# Patient Record
Sex: Male | Born: 1940 | Race: White | Hispanic: No | Marital: Single | Smoking: Former smoker
Health system: Southern US, Community
[De-identification: ages and names within clinical notes are randomized; demographics above are authoritative.]

## PROBLEM LIST (undated history)

## (undated) DIAGNOSIS — E559 Vitamin D deficiency, unspecified: Secondary | ICD-10-CM

## (undated) DIAGNOSIS — I700 Atherosclerosis of aorta: Secondary | ICD-10-CM

## (undated) DIAGNOSIS — I10 Essential (primary) hypertension: Secondary | ICD-10-CM

## (undated) DIAGNOSIS — K22719 Barrett's esophagus with dysplasia, unspecified: Secondary | ICD-10-CM

## (undated) DIAGNOSIS — K219 Gastro-esophageal reflux disease without esophagitis: Secondary | ICD-10-CM

## (undated) HISTORY — PX: TONSILLECTOMY: SUR1361

## (undated) HISTORY — DX: Vitamin D deficiency, unspecified: E55.9

## (undated) HISTORY — PX: KNEE SURGERY: SHX244

## (undated) HISTORY — PX: SHOULDER SURGERY: SHX246

## (undated) HISTORY — PX: CARPAL TUNNEL RELEASE: SHX101

## (undated) HISTORY — DX: Atherosclerosis of aorta: I70.0

## (undated) HISTORY — DX: Barrett's esophagus with dysplasia, unspecified: K22.719

---

## 2005-02-25 ENCOUNTER — Ambulatory Visit (HOSPITAL_COMMUNITY): Admission: RE | Admit: 2005-02-25 | Discharge: 2005-02-25 | Payer: Self-pay | Admitting: Gastroenterology

## 2008-05-03 ENCOUNTER — Inpatient Hospital Stay (HOSPITAL_COMMUNITY): Admission: EM | Admit: 2008-05-03 | Discharge: 2008-05-06 | Payer: Self-pay | Admitting: Emergency Medicine

## 2010-07-15 LAB — CBC
HCT: 41.1 % (ref 39.0–52.0)
HCT: 45.2 % (ref 39.0–52.0)
HCT: 46.4 % (ref 39.0–52.0)
Hemoglobin: 13.9 g/dL (ref 13.0–17.0)
Hemoglobin: 15.6 g/dL (ref 13.0–17.0)
MCHC: 33.9 g/dL (ref 30.0–36.0)
MCV: 94.2 fL (ref 78.0–100.0)
MCV: 94.4 fL (ref 78.0–100.0)
MCV: 94.8 fL (ref 78.0–100.0)
Platelets: 136 10*3/uL — ABNORMAL LOW (ref 150–400)
Platelets: 187 10*3/uL (ref 150–400)
RBC: 4.36 MIL/uL (ref 4.22–5.81)
RBC: 4.36 MIL/uL (ref 4.22–5.81)
RBC: 4.87 MIL/uL (ref 4.22–5.81)
RDW: 13.8 % (ref 11.5–15.5)
WBC: 12.1 10*3/uL — ABNORMAL HIGH (ref 4.0–10.5)
WBC: 13.5 10*3/uL — ABNORMAL HIGH (ref 4.0–10.5)
WBC: 16.8 10*3/uL — ABNORMAL HIGH (ref 4.0–10.5)
WBC: 19.8 10*3/uL — ABNORMAL HIGH (ref 4.0–10.5)

## 2010-07-15 LAB — CARDIAC PANEL(CRET KIN+CKTOT+MB+TROPI)
CK, MB: 1.3 ng/mL (ref 0.3–4.0)
CK, MB: 1.4 ng/mL (ref 0.3–4.0)
Relative Index: 1.3 (ref 0.0–2.5)
Total CK: 136 U/L (ref 7–232)
Troponin I: <0.01 ng/mL (ref 0.00–0.06)
Troponin I: <0.01 ng/mL (ref 0.00–0.06)

## 2010-07-15 LAB — COMPREHENSIVE METABOLIC PANEL
ALT: 18 U/L (ref 0–53)
ALT: 21 U/L (ref 0–53)
AST: 21 U/L (ref 0–37)
Alkaline Phosphatase: 31 U/L — ABNORMAL LOW (ref 39–117)
Alkaline Phosphatase: 38 U/L — ABNORMAL LOW (ref 39–117)
BUN: 14 mg/dL (ref 6–23)
CO2: 21 mEq/L (ref 19–32)
CO2: 27 mEq/L (ref 19–32)
CO2: 28 mEq/L (ref 19–32)
Calcium: 8.1 mg/dL — ABNORMAL LOW (ref 8.4–10.5)
Chloride: 101 mEq/L (ref 96–112)
Chloride: 106 mEq/L (ref 96–112)
Chloride: 97 mEq/L (ref 96–112)
Creatinine, Ser: 0.94 mg/dL (ref 0.4–1.5)
Creatinine, Ser: 1.05 mg/dL (ref 0.4–1.5)
GFR calc Af Amer: >60 mL/min (ref >60)
GFR calc non Af Amer: >60 mL/min (ref >60)
GFR calc non Af Amer: >60 mL/min (ref >60)
Glucose, Bld: 108 mg/dL — ABNORMAL HIGH (ref 70–99)
Glucose, Bld: 137 mg/dL — ABNORMAL HIGH (ref 70–99)
Potassium: 3.2 mEq/L — ABNORMAL LOW (ref 3.5–5.1)
Potassium: 3.5 mEq/L (ref 3.5–5.1)
Sodium: 136 mEq/L (ref 135–145)
Total Bilirubin: 0.9 mg/dL (ref 0.3–1.2)
Total Bilirubin: 1.2 mg/dL (ref 0.3–1.2)
Total Bilirubin: 1.5 mg/dL — ABNORMAL HIGH (ref 0.3–1.2)
Total Protein: 6.4 g/dL (ref 6.0–8.3)

## 2010-07-15 LAB — URINALYSIS, ROUTINE W REFLEX MICROSCOPIC
Bilirubin Urine: NEGATIVE
Hgb urine dipstick: NEGATIVE
Protein, ur: NEGATIVE mg/dL
Specific Gravity, Urine: 1.019 (ref 1.005–1.030)
Urobilinogen, UA: 1 mg/dL (ref 0.0–1.0)

## 2010-07-15 LAB — BASIC METABOLIC PANEL
BUN: 7 mg/dL (ref 6–23)
Chloride: 103 mEq/L (ref 96–112)
Creatinine, Ser: 1.21 mg/dL (ref 0.4–1.5)
GFR calc Af Amer: >60 mL/min (ref >60)
GFR calc non Af Amer: 60 mL/min — ABNORMAL LOW (ref >60)

## 2010-07-15 LAB — LIPASE, BLOOD
Lipase: 21 U/L (ref 11–59)
Lipase: 26 U/L (ref 11–59)
Lipase: 34 U/L (ref 11–59)

## 2010-07-15 LAB — CULTURE, BLOOD (ROUTINE X 2)

## 2010-07-15 LAB — DIFFERENTIAL
Basophils Absolute: 0 10*3/uL (ref 0.0–0.1)
Basophils Absolute: 0 10*3/uL (ref 0.0–0.1)
Basophils Relative: 0 % (ref 0–1)
Basophils Relative: 0 % (ref 0–1)
Eosinophils Absolute: 0 10*3/uL (ref 0.0–0.7)
Eosinophils Absolute: 0.1 10*3/uL (ref 0.0–0.7)
Eosinophils Relative: 0 % (ref 0–5)
Lymphocytes Relative: 3 % — ABNORMAL LOW (ref 12–46)
Lymphocytes Relative: 7 % — ABNORMAL LOW (ref 12–46)
Monocytes Absolute: 1 10*3/uL (ref 0.1–1.0)
Neutrophils Relative %: 92 % — ABNORMAL HIGH (ref 43–77)

## 2010-07-15 LAB — LIPID PANEL
Cholesterol: 132 mg/dL (ref 0–200)
Total CHOL/HDL Ratio: 2.9 RATIO

## 2010-07-15 LAB — TSH: TSH: 0.447 u[IU]/mL (ref 0.350–4.500)

## 2010-07-15 LAB — PROTIME-INR: Prothrombin Time: 15.3 seconds — ABNORMAL HIGH (ref 11.6–15.2)

## 2010-07-15 LAB — BRAIN NATRIURETIC PEPTIDE: Pro B Natriuretic peptide (BNP): 149 pg/mL — ABNORMAL HIGH (ref 0.0–100.0)

## 2010-07-15 LAB — AMYLASE: Amylase: 30 U/L (ref 27–131)

## 2010-07-15 LAB — HEMOGLOBIN A1C: Hgb A1c MFr Bld: 5.5 % (ref 4.6–6.1)

## 2010-08-12 NOTE — Discharge Summary (Signed)
NAME:  Jerome Pham, Jerome Pham              ACCOUNT NO.:  [id number]   MEDICAL RECORD NO.:  [id number]          PATIENT TYPE:  INP   LOCATION:  1426                         FACILITY:  [facility]   PHYSICIAN:  Corinna L. Lendell Caprice, MDDATE OF BIRTH:  1940/04/16   DATE OF ADMISSION:  05/03/2008  DATE OF DISCHARGE:  05/06/2008                               DISCHARGE SUMMARY   DISCHARGE DIAGNOSES:  1. Vomiting and abdominal pain possibly gastroenteritis.  2. Hypokalemia, repleted.  3. Gastroesophageal reflux disease.  4. History of hypertension.   DISCHARGE MEDICATIONS:  1. Flagyl 500 mg p.o. t.i.d. until gone.  2. Cipro 500 mg p.o. b.i.d. until gone.  3. Phenergan 25 mg p.o. q.6 hours p.r.n. nausea.  4. Tylenol 650 mg every 4 hours as needed for fever or pain.  5. Imodium 2 mg as needed for diarrhea.  6. Hold Diovan/hydrochlorothiazide for a week.  7. Continue Protonix 40 mg daily.   FOLLOW UP:  With Dr. Theresia Lo next week.  Follow up with Dr. Colin Benton if  symptoms worsen, particularly pain.   CONDITION:  Improved.   ACTIVITY:  No driving while on nausea medications.   DIET:  As tolerated.   CONSULTATIONS:  Dr. Alfonse Ras.   PROCEDURES:  None.   LABS ON ADMISSION:  Shows white count was 12,000, 88% neutrophils, 7%  lymphocytes.  PT/PTT normal.  Complete metabolic panel on admission  significant for a potassium of 3.2, at discharge is 3.5.  Liver function  tests unremarkable.  Lipase negative.  Hemoglobin A1c 5.5.  Amylase  normal.  BNP 149.  Serial cardiac enzymes negative.  LDL 78, HDL 46,  cholesterol 132, TSH 0.447.  Urinalysis showed 15 ketones, otherwise  negative.  Blood cultures negative.   SPECIAL STUDIES/RADIOLOGY:  EKG shows normal sinus bradycardia with a  rate of 54, left assays axis deviation, and nonspecific changes.  Chest  x-ray, two views showed nothing acute.  Ultrasound of the abdomen showed  nothing acute.  Abdominal aorta and pancreas obscured by bowel gas.  Liver, gallbladder, common duct, intrahepatic IVC, spleen and kidneys  unremarkable.  No ascites.  CT of the abdomen and pelvis showed  gallbladder distended with mild edema in its wall and pericholecystic  edema/inflammation, enhancement of the wall the common bile duct without  evidence of bile duct dilatation, no gallstones.  Edema and inflammation  also surrounding the descending portion of the duodenum, 13 mm nodule in  the left adrenal gland, left kidney cyst, mild distal abdominal aortic  atherosclerosis, small hiatal hernia.  Apparent thickening of the wall  of the gastric fundus felt to be related to under distention.  Lung bases demonstrate linear atelectasis findings consistent with  acalculous cholecystitis versus peptic ulcer disease in the descending  duodenum.  Favor acalculous cholecystitis.  Enhancement of the wall of  the nondistended common duct, query cholangitis.  HIDA scan showed  initial non-filling of the gallbladder over 60 minutes with prompt  filling after administration of morphine, suggestive of more likely  chronic pattern of cholecystitis.  No evidence of biliary ductal  dilatation.   HISTORY AND HOSPITAL  COURSE:  Mr. Jerome Pham is a 70 year old white male  patient of Dr. Theresia Lo who presented with sudden onset epigastric pain,  burning, diaphoresis, nausea.  He did have dry heaves.  Please see H and  P for details.  He was afebrile, had a heart rate of 54, respiratory  rate of 20, otherwise normal vital signs.  He had slight epigastric  tenderness.  White blood cell count was 12,000.  The patient was  admitted, ruled out for MI.  There was concern that this was related to  gastroesophageal reflux disease.  When I saw the patient on the fifth he  was complaining of right upper quadrant pain after eating.  He had  vomited and spiked a fever.  I have put him empirically on ciprofloxacin  and ordered right upper quadrant ultrasound which was unremarkable.   Within the differential was gastroenteritis as well.  The patient had a  subsequent CAT scan which showed possible cholecystitis and the Flagyl  was added.  Dr. Colin Benton was consulted and recommended HIDA scan.  She  evaluated the patient on the 7th and felt that he had possibly chronic  cholecystitis, probable viral gastroenteritis and did not recommend  cholecystectomy at this time.  The patient also developed diarrhea  during the hospitalization.  At the time of discharge he has no right  upper quadrant tenderness.  His appetite is not back to his baseline but  he complains of no nausea currently.  His abdomen is soft and nontender  today.  The patient was adamant about leaving to go to a [employer].  I have voiced concern about his leukocytosis, fever,  and he voices understanding.  I have given him a prescription for  ciprofloxacin, Flagyl, Phenergan rather than have him sign out against  medical advice.  I did strongly advise that he come back immediately for  worsening of his symptoms.  I also recommended close followup with Dr.  Theresia Lo, and/or Dr. Colin Benton should his symptoms worsen.  The patient's  blood pressure was normal off his blood pressure medications and will  need to be held for at least a week or until his blood pressure becomes  elevated.  Total time on the day of discharge is 45 minutes.  I have  read Dr. Tami Lin note and spoken with her yesterday and today.      Corinna L. Lendell Caprice, MD  Electronically Signed     CLS/MEDQ  D:  05/06/2008  T:  05/06/2008  Job:  [id number]   cc:   Vikki Ports, M.D.  Fax: [phone]   Alfonse Ras, MD  [address], [address]  [place]  [place] [address]

## 2010-08-12 NOTE — H&P (Signed)
NAME:  Jerome Pham, Jerome Pham NO.:  [id number]   MEDICAL RECORD NO.:  [id number]          PATIENT TYPE:  INP   LOCATION:  0104                         FACILITY:  [facility]   PHYSICIAN:  Michiel Cowboy, MDDATE OF BIRTH:  1940/11/26   DATE OF ADMISSION:  05/03/2008  DATE OF DISCHARGE:                              HISTORY & PHYSICAL   PRIMARY CARE PHYSICIAN:  Dr. Theresia Lo.   CHIEF COMPLAINT:  Epigastric pain, dry heaves and diaphoresis.   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  The pain was not radiating neither to the neck or his arm.  He  did have any shortness of breath associated with that but he went to his  primary care Shakhia Gramajo who obtained an EKG which was unchanged from prior  but still felt that given his age and risk factors probably would  benefit from having further evaluation in the emergency department where  he presented.  Here his first set of markers are unremarkable.  EKG  appears to be nonischemic with a normal chest x-ray.  At which point  [facility] hospitalist was called for further evaluation.  The patient  otherwise denies any fevers,  no chills and no history of exertional  chest pain or shortness of breath.  He does play golf and stays fairly  active.  He himself does not have any history of coronary artery  disease.   PAST MEDICAL HISTORY:  Significant for GERD and hypertension.   SOCIAL HISTORY:  The patient used to smoke but quit in 1969.  He drinks  about three beers per day.  Never had history of withdrawal. When he  occasionally does stop. He has no symptoms consistent with withdrawal.   FAMILY HISTORY:  Consistent with a  father who has had heart disease at  an early age and had four brothers who died of  heart disease.  The  patient himself is an only child.   ALLERGIES:  NO KNOWN DRUG ALLERGIES.   MEDICATIONS:  1. Recently was switched from Nexium to Prilosec 20 mg daily.  2. Diovan/ hydrochlorothiazide 160/12.5 daily.   REVIEW OF SYSTEMS:  Negative except for his  HPI.   VITAL SIGNS:  Temperature 97.1, blood pressure 129/75, pulse 54,  respirations 20, saturating 100% on room air.   PHYSICAL EXAMINATION:  The patient appears to be in no acute distress.  HEAD:  Nontraumatic.  Moist mucous membranes but slightly decreased skin  turgor.  LUNGS:  Clear to auscultation bilaterally.  HEART:  Regular rate and rhythm, although slightly distant but no  murmurs appreciated.  ABDOMEN:  There is slight epigastric tenderness but otherwise  unremarkable.  LOWER EXTREMITIES:  Without clubbing, cyanosis or edema.  NEUROLOGIC:  Intact.   LABS:  White blood cell count 12.1, hemoglobin  15.2, sodium 134,  potassium 3.2, creatinine 1.05, lipase 26.  Cardiac markers are  unremarkable.  UA within normal limits.  Chest x-ray within normal  limits.  EKG showing heart rate of 54, sinus bradycardia with sinus  arrhythmia.  There is a slight flattening of T wave in lead III and aVF;  overall there is also a slightly diminished QRS.  But EKG  appears to be overall nonischemic.  Per comparison to EKG obtained at  the office no change and there is also no significant  change in  comparison to EKG obtained from November 2006.   Of note, while in the emergency depart the patient received a dose of  nitroglycerin which  dropped his blood pressure.  The patient was  chest  pain free by the time he got to the emergency department and currently  still  is chest pain free.   ASSESSMENT/PLAN:  This is a 70 year old gentleman with history of  gastroesophageal reflux disease and hypertension defect over the past  medical history  who presents with epigastric pain and heartburn but also  some diaphoresis and possibly chest pain.   Chest pain very atypical in nature but given the family history and risk  factors will cycle cardiac enzymes and obtain fasting lipid panel check  hemoglobin A1c.   History of severe gastroesophageal reflux disease.  We will put on  Protonix and Carafate and see if this helps.  Recheck lipase again  given epigastric tenderness.  The patient may need to have GI follow-up  if this persists.   Elevated white blood cell count; I wonder if patient is somewhat  hemoconcentrated.  Will give  fluids and follow.  Currently no evidence  of infection.   Low potassium.  Already replaced.   Hyponatremia, also very mild and probably secondary to mild dehydration.  Will give IV fluids and follow.   Prophylaxis with Lovenox and Protonix.  The patient already received  aspirin in the emergency department and given severe GERD will probably  need to be on Protonix while taking aspirin but as long as he does not  have any contraindications, perhaps being on aspirin would be a good  plan for him for cardiac protection.      Michiel Cowboy, MD  Electronically Signed     AVD/MEDQ  D:  05/03/2008  T:  05/03/2008  Job:  [id number]   cc:   Vikki Ports, M.D.  Fax: [phone]

## 2010-08-12 NOTE — Consult Note (Signed)
NAME:  Jerome Pham, Jerome Pham NO.:  [id number]   MEDICAL RECORD NO.:  [id number]          PATIENT TYPE:  INP   LOCATION:  1426                         FACILITY:  [facility]   PHYSICIAN:  Alfonse Ras, MD   DATE OF BIRTH:  1941-03-06   DATE OF CONSULTATION:  05/06/2008  DATE OF DISCHARGE:                                 CONSULTATION   REASON FOR REFERRAL:  Abdominal pain, rule out cholecystitis.   HISTORY OF PRESENT ILLNESS:  The patient is a very pleasant 70 year old  gentleman who was admitted approximately 3 days ago with epigastric  discomfort and dry heaves.  Over the last 2 days, however, this has  seemed to resolve, although he has started having some diarrhea.  Workup  has included an ultrasound which showed no stones and no inflammation  around the gallbladder.  However CT scan of the abdomen did show some  possible inflammation of the gallbladder wall but again no stones.  HIDA  scan done this morning shows no evidence of cystic duct obstruction,  questionable chronic cholecystitis because of the CT findings.  This  morning on my exam, he says his nausea has resolved.  He did have 2  episodes of diarrhea and denies any abdominal pain.  His white count was  elevated at 16,000 yesterday and is not back for my review this morning.  His liver function tests were normal yesterday.   PAST MEDICAL HISTORY:  Significant for gastroesophageal reflux disease  and hypertension.   SOCIAL HISTORY:  He is a daily user of alcohol about 3 to 6 beers a day.   FAMILY HISTORY:  Noncontributory.   MEDICATIONS:  Include diet and hydrochlorothiazide.   PHYSICAL EXAMINATION:  GENERAL: An age-appropriate white male.  He is in  his bathroom shaving and is in no distress.  VITAL SIGNS: His temperature at present is 98.6, his blood pressure is  124/70, heart rate is 58, and respiratory rate is 16.  HEENT EXAM: Normocephalic and atraumatic.  Pupils are equal, round, and  reactive to  light.  LUNGS: Clear to auscultation and percussion x2.  HEART: Regular rate and rhythm without murmurs, rubs, or gallops.  ABDOMEN: Soft and I do not feel any tenderness in the right upper  quadrant.  EXTREMITY: Exam shows no clubbing, cyanosis, or edema.   IMPRESSION:  Probable viral gastroenteritis, questionable chronic  cholecystitis.  I do not see an indication for cholecystectomy at this  time.  I discussed this with Dr. Crista Curb, and she will manage  the patient with probable antibiotics and I except discharge home.      Alfonse Ras, MD  Electronically Signed     KRE/MEDQ  D:  05/06/2008  T:  05/07/2008  Job:  [id number]

## 2010-08-15 NOTE — Op Note (Signed)
NAME:  Jerome Pham, Jerome Pham NO.:  [id number]   MEDICAL RECORD NO.:  [id number]          PATIENT TYPE:  AMB   LOCATION:  ENDO                         FACILITY:  [facility]   PHYSICIAN:  Petra Kuba, M.D.    DATE OF BIRTH:  10/01/1940   DATE OF PROCEDURE:  02/25/2005  DATE OF DISCHARGE:                                 OPERATIVE REPORT   PROCEDURE:  Colonoscopy.   INDICATIONS FOR PROCEDURE:  Screening.  Consent was signed after risks,  benefits, methods, and options were thoroughly discussed in the office.   MEDICATIONS USED:  Demerol 60, Versed 6.   PROCEDURE:  Rectal inspection was pertinent for small external hemorrhoids.  Digital exam was negative.  The video colonoscope was inserted and easily  advanced around the colon to the cecum.  This did require rolling him on his  back and some abdominal pressure.  Other than an occasional left and right  diverticula, no abnormalities were seen on insertion.  The cecum was  identified by the appendiceal orifice and the ileocecal valve.  The scope  was inserted a short ways in the terminal ileum which was normal.  Photo  documentation was obtained.  The scope was slowly withdrawn.  The prep was  adequate, there was some liquid stool that required washing and suctioning.  On slow withdrawal through the colon, other than the scattered, ascending,  and primarily scattered sigmoid diverticula, no other abnormalities were  seen.  Specifically, no polyps, tumors, or masses.  Once back in the rectum,  anorectal pull through and retroflexion confirmed some small hemorrhoids.  The scope was straightened and readvanced a short ways up the left side of  the colon.  The air was suctioned, the scope was removed.  The patient  tolerated the procedure well.  There was no obvious immediate complications.   ENDOSCOPIC DIAGNOSIS:  1.  Internal and external hemorrhoids.  2.  Scattered sigmoid and ascending diverticula.  3.  Otherwise, within  normal limits to the terminal ileum.   PLAN:  Recheck colon screening in 5-10 years.  Happy to see back p.r.n.  Otherwise, return to the care of Dr. Idell Pickles for the customary health care  screening and maintenance to include yearly rectals and guaiacs.           ______________________________  Petra Kuba, M.D.     MEM/MEDQ  D:  02/25/2005  T:  02/25/2005  Job:  [id number]   cc:   Dellis Anes. Idell Pickles, M.D.  Fax: [phone]

## 2016-07-16 DIAGNOSIS — Z125 Encounter for screening for malignant neoplasm of prostate: Secondary | ICD-10-CM | POA: Diagnosis not present

## 2016-07-16 DIAGNOSIS — Z0000 Encounter for general adult medical examination without abnormal findings: Secondary | ICD-10-CM | POA: Diagnosis not present

## 2016-07-16 DIAGNOSIS — I10 Essential (primary) hypertension: Secondary | ICD-10-CM | POA: Diagnosis not present

## 2016-07-16 DIAGNOSIS — E559 Vitamin D deficiency, unspecified: Secondary | ICD-10-CM | POA: Diagnosis not present

## 2016-07-16 DIAGNOSIS — Z23 Encounter for immunization: Secondary | ICD-10-CM | POA: Diagnosis not present

## 2016-12-03 ENCOUNTER — Encounter (HOSPITAL_COMMUNITY): Payer: Self-pay | Admitting: *Deleted

## 2016-12-03 ENCOUNTER — Emergency Department (HOSPITAL_COMMUNITY): Payer: Medicare HMO

## 2016-12-03 ENCOUNTER — Observation Stay (HOSPITAL_COMMUNITY)
Admission: EM | Admit: 2016-12-03 | Discharge: 2016-12-04 | Disposition: A | Discharge status: Home / Self Care | Payer: Medicare HMO | Attending: Family Medicine | Admitting: Family Medicine

## 2016-12-03 DIAGNOSIS — R1013 Epigastric pain: Secondary | ICD-10-CM | POA: Diagnosis not present

## 2016-12-03 DIAGNOSIS — K219 Gastro-esophageal reflux disease without esophagitis: Secondary | ICD-10-CM | POA: Diagnosis not present

## 2016-12-03 DIAGNOSIS — I959 Hypotension, unspecified: Secondary | ICD-10-CM | POA: Diagnosis not present

## 2016-12-03 DIAGNOSIS — K5730 Diverticulosis of large intestine without perforation or abscess without bleeding: Secondary | ICD-10-CM | POA: Insufficient documentation

## 2016-12-03 DIAGNOSIS — Z7289 Other problems related to lifestyle: Secondary | ICD-10-CM | POA: Diagnosis present

## 2016-12-03 DIAGNOSIS — K449 Diaphragmatic hernia without obstruction or gangrene: Secondary | ICD-10-CM | POA: Diagnosis not present

## 2016-12-03 DIAGNOSIS — Z7982 Long term (current) use of aspirin: Secondary | ICD-10-CM | POA: Diagnosis not present

## 2016-12-03 DIAGNOSIS — Z87891 Personal history of nicotine dependence: Secondary | ICD-10-CM | POA: Diagnosis not present

## 2016-12-03 DIAGNOSIS — I10 Essential (primary) hypertension: Secondary | ICD-10-CM | POA: Diagnosis not present

## 2016-12-03 DIAGNOSIS — R1010 Upper abdominal pain, unspecified: Secondary | ICD-10-CM | POA: Diagnosis not present

## 2016-12-03 DIAGNOSIS — K8020 Calculus of gallbladder without cholecystitis without obstruction: Secondary | ICD-10-CM | POA: Insufficient documentation

## 2016-12-03 DIAGNOSIS — R079 Chest pain, unspecified: Secondary | ICD-10-CM | POA: Diagnosis not present

## 2016-12-03 DIAGNOSIS — R109 Unspecified abdominal pain: Secondary | ICD-10-CM | POA: Diagnosis not present

## 2016-12-03 DIAGNOSIS — Z79899 Other long term (current) drug therapy: Secondary | ICD-10-CM | POA: Diagnosis not present

## 2016-12-03 DIAGNOSIS — Z789 Other specified health status: Secondary | ICD-10-CM | POA: Diagnosis present

## 2016-12-03 HISTORY — DX: Gastro-esophageal reflux disease without esophagitis: K21.9

## 2016-12-03 HISTORY — DX: Essential (primary) hypertension: I10

## 2016-12-03 LAB — CBC
HEMATOCRIT: 47.4 % (ref 39.0–52.0)
HEMOGLOBIN: 16.2 g/dL (ref 13.0–17.0)
MCH: 32.2 pg (ref 26.0–34.0)
MCHC: 34.2 g/dL (ref 30.0–36.0)
MCV: 94.2 fL (ref 78.0–100.0)
Platelets: 211 10*3/uL (ref 150–400)
RBC: 5.03 MIL/uL (ref 4.22–5.81)
RDW: 13 % (ref 11.5–15.5)
WBC: 8.8 10*3/uL (ref 4.0–10.5)

## 2016-12-03 LAB — URINALYSIS, ROUTINE W REFLEX MICROSCOPIC
Bilirubin Urine: NEGATIVE
GLUCOSE, UA: NEGATIVE mg/dL
Hgb urine dipstick: NEGATIVE
KETONES UR: NEGATIVE mg/dL
LEUKOCYTES UA: NEGATIVE
NITRITE: NEGATIVE
PH: 7 (ref 5.0–8.0)
Protein, ur: NEGATIVE mg/dL
Specific Gravity, Urine: 1.019 (ref 1.005–1.030)

## 2016-12-03 LAB — I-STAT CG4 LACTIC ACID, ED: LACTIC ACID, VENOUS: 1.04 mmol/L (ref 0.5–1.9)

## 2016-12-03 LAB — COMPREHENSIVE METABOLIC PANEL
ALT: 29 U/L (ref 17–63)
ANION GAP: 8 (ref 5–15)
AST: 56 U/L — ABNORMAL HIGH (ref 15–41)
Albumin: 3.8 g/dL (ref 3.5–5.0)
Alkaline Phosphatase: 40 U/L (ref 38–126)
BILIRUBIN TOTAL: 1.6 mg/dL — AB (ref 0.3–1.2)
BUN: 12 mg/dL (ref 6–20)
CHLORIDE: 102 mmol/L (ref 101–111)
CO2: 28 mmol/L (ref 22–32)
Calcium: 9.2 mg/dL (ref 8.9–10.3)
Creatinine, Ser: 1.04 mg/dL (ref 0.61–1.24)
Glucose, Bld: 123 mg/dL — ABNORMAL HIGH (ref 65–99)
POTASSIUM: 4.2 mmol/L (ref 3.5–5.1)
Sodium: 138 mmol/L (ref 135–145)
TOTAL PROTEIN: 6.3 g/dL — AB (ref 6.5–8.1)

## 2016-12-03 LAB — LIPASE, BLOOD: LIPASE: 35 U/L (ref 11–51)

## 2016-12-03 LAB — I-STAT TROPONIN, ED: Troponin i, poc: 0 ng/mL (ref 0.00–0.08)

## 2016-12-03 LAB — BRAIN NATRIURETIC PEPTIDE: B Natriuretic Peptide: 49.4 pg/mL (ref 0.0–100.0)

## 2016-12-03 LAB — TROPONIN I

## 2016-12-03 MED ORDER — ASPIRIN 325 MG PO TABS
325.0000 mg | ORAL_TABLET | Freq: Every day | ORAL | Status: DC
  Administered 2016-12-03: 325 mg via ORAL
  Filled 2016-12-03 (×2): qty 1

## 2016-12-03 MED ORDER — LOSARTAN POTASSIUM 50 MG PO TABS: 50.0000 mg | ORAL_TABLET | Freq: Every day | ORAL | Status: DC

## 2016-12-03 MED ORDER — ZOLPIDEM TARTRATE 5 MG PO TABS
5.0000 mg | ORAL_TABLET | Freq: Every evening | ORAL | Status: DC | PRN
  Filled 2016-12-03: qty 1

## 2016-12-03 MED ORDER — ADULT MULTIVITAMIN W/MINERALS CH
1.0000 | ORAL_TABLET | Freq: Every day | ORAL | Status: DC
  Administered 2016-12-03: 1 via ORAL
  Filled 2016-12-03 (×2): qty 1

## 2016-12-03 MED ORDER — HYDROCHLOROTHIAZIDE 12.5 MG PO CAPS
12.5000 mg | ORAL_CAPSULE | Freq: Every day | ORAL | Status: DC
  Administered 2016-12-04: 12.5 mg via ORAL
  Filled 2016-12-03: qty 1

## 2016-12-03 MED ORDER — IOPAMIDOL (ISOVUE-370) INJECTION 76%
INTRAVENOUS | Status: AC
  Administered 2016-12-03: 100 mL
  Filled 2016-12-03: qty 100

## 2016-12-03 MED ORDER — PANTOPRAZOLE SODIUM 40 MG PO TBEC
40.0000 mg | DELAYED_RELEASE_TABLET | Freq: Every day | ORAL | Status: DC
  Administered 2016-12-04: 40 mg via ORAL
  Filled 2016-12-03: qty 1

## 2016-12-03 MED ORDER — ONDANSETRON HCL 4 MG/2ML IJ SOLN: 4.0000 mg | Freq: Three times a day (TID) | INTRAMUSCULAR | Status: DC | PRN

## 2016-12-03 MED ORDER — LORAZEPAM 2 MG/ML IJ SOLN
0.0000 mg | Freq: Four times a day (QID) | INTRAMUSCULAR | Status: DC
  Administered 2016-12-03: 1 mg via INTRAVENOUS
  Filled 2016-12-03: qty 1

## 2016-12-03 MED ORDER — RANITIDINE HCL 150 MG PO TABS: 150.0000 mg | ORAL_TABLET | Freq: Every day | ORAL | 0 refills | Status: DC

## 2016-12-03 MED ORDER — LORAZEPAM 2 MG/ML IJ SOLN: 0.0000 mg | Freq: Two times a day (BID) | INTRAMUSCULAR | Status: DC

## 2016-12-03 MED ORDER — SUCRALFATE 1 G PO TABS
1.0000 g | ORAL_TABLET | Freq: Three times a day (TID) | ORAL | 0 refills | Status: DC
Start: 2016-12-03 — End: 2016-12-04

## 2016-12-03 MED ORDER — LORAZEPAM 2 MG/ML IJ SOLN: 1.0000 mg | Freq: Four times a day (QID) | INTRAMUSCULAR | Status: DC | PRN

## 2016-12-03 MED ORDER — LORAZEPAM 2 MG/ML IJ SOLN
1.0000 mg | Freq: Once | INTRAMUSCULAR | Status: AC
  Administered 2016-12-03: 1 mg via INTRAVENOUS
  Filled 2016-12-03: qty 1

## 2016-12-03 MED ORDER — OMEPRAZOLE 20 MG PO CPDR: 20.0000 mg | DELAYED_RELEASE_CAPSULE | Freq: Every day | ORAL | 0 refills | Status: AC

## 2016-12-03 MED ORDER — LOSARTAN POTASSIUM 50 MG PO TABS
50.0000 mg | ORAL_TABLET | Freq: Every day | ORAL | Status: DC
  Administered 2016-12-04: 50 mg via ORAL
  Filled 2016-12-03: qty 1

## 2016-12-03 MED ORDER — GI COCKTAIL ~~LOC~~
30.0000 mL | Freq: Once | ORAL | Status: AC
  Administered 2016-12-03: 30 mL via ORAL
  Filled 2016-12-03: qty 30

## 2016-12-03 MED ORDER — HYDRALAZINE HCL 20 MG/ML IJ SOLN: 5.0000 mg | INTRAMUSCULAR | Status: DC | PRN

## 2016-12-03 MED ORDER — THIAMINE HCL 100 MG/ML IJ SOLN
100.0000 mg | Freq: Every day | INTRAMUSCULAR | Status: DC
  Administered 2016-12-03: 100 mg via INTRAVENOUS
  Filled 2016-12-03: qty 2

## 2016-12-03 MED ORDER — FOLIC ACID 1 MG PO TABS
1.0000 mg | ORAL_TABLET | Freq: Every day | ORAL | Status: DC
  Administered 2016-12-03: 1 mg via ORAL
  Filled 2016-12-03 (×2): qty 1

## 2016-12-03 MED ORDER — ACETAMINOPHEN 325 MG PO TABS: 650.0000 mg | ORAL_TABLET | Freq: Four times a day (QID) | ORAL | Status: DC | PRN

## 2016-12-03 MED ORDER — VITAMIN B-1 100 MG PO TABS
100.0000 mg | ORAL_TABLET | Freq: Every day | ORAL | Status: DC
  Filled 2016-12-03: qty 1

## 2016-12-03 MED ORDER — ACETAMINOPHEN 650 MG RE SUPP: 650.0000 mg | Freq: Four times a day (QID) | RECTAL | Status: DC | PRN

## 2016-12-03 MED ORDER — SODIUM CHLORIDE 0.9 % IV SOLN
INTRAVENOUS | Status: DC
Start: 2016-12-03 — End: 2016-12-04
  Administered 2016-12-03 – 2016-12-04 (×2): via INTRAVENOUS

## 2016-12-03 MED ORDER — ENOXAPARIN SODIUM 40 MG/0.4ML ~~LOC~~ SOLN
40.0000 mg | SUBCUTANEOUS | Status: DC
  Administered 2016-12-03: 40 mg via SUBCUTANEOUS
  Filled 2016-12-03: qty 0.4

## 2016-12-03 MED ORDER — LORAZEPAM 1 MG PO TABS: 1.0000 mg | ORAL_TABLET | Freq: Four times a day (QID) | ORAL | Status: DC | PRN

## 2016-12-03 NOTE — ED Notes (Signed)
Patient requesting to see doctor before anymore lab work is drawn. Will notify EDP

## 2016-12-03 NOTE — ED Triage Notes (Signed)
Pt reports upper abdominal pain and diaphosesis. Pt states that he took his bp at home ant it was normal.  states that he went to his PCP and they reported that his BP was 70 systolic. Ems was called. Abdominal pain decreased. Vitals with EMS are all WNL.

## 2016-12-03 NOTE — Discharge Instructions (Signed)
Keep track of your blood pressure. If it is low (<100/60), do not take your blood pressure medications.  Drink plenty of fluid.  You should call your primary doctor and follow-up in 2-3 days for repeat exam, and possible referral for a STRESS TEST to further work-up your episode

## 2016-12-03 NOTE — ED Notes (Signed)
EDP made aware that patient is requesting to see him

## 2016-12-03 NOTE — H&P (Signed)
History and Physical    Jerome Pham YQM:[id number] DOB: 10/20/1940 DOA: 12/03/2016  Referring MD/NP/PA:   PCP: Patient, No Pcp Per   Patient coming from:  The patient is coming from home.  At baseline, pt is independent for most of ADL.   Chief Complaint: Epigastric abdominal pain, diaphoresis, hypotension  HPI: Jerome Pham is a 76 y.o. male with medical history significant of hypertension, GERD, alcohol use (1 to 2 glass of wine daily), who presents with epigastric abdominal pain, diaphoresis and hypotension.  Pt states that he has been normal in whole morning.  He did exercise in Gym in morning, and was walking outside in the heat, then came inside. He walked to sit down when he began to feel very hot and flushed at noon. He started having epigastric abdominal pain, which was moderate, sharp, nonradiating. It is associated with nausea and diaphoresis, but no vomiting or diarrhea. Patient denies chest pain or shortness of breath. He went to see his PCP and was found to have Bp 70/29. He denies any recent medication changes. His symptoms lasted for about 2.5 hours, then resolved spontaneously. Patient did not have unilateral weakness, numbness or tingling to extremities, vision change, slurred speech or hearing loss. No fever or chills. No symptoms of UTI. Pt states that he drinks 1 to 2 glass of wine daily.  ED Course: pt was found to have blood pressure 176/71, mild tachycardia, mild tachypnea, O2 sat 93% on room air, temperature normal. WBC 8.8, electrolytes renal function okay, lactic acid 1.04, negative troponin, BNP 49.4, lipase is 35, negative urinalysis. Patient is placed on telemetry bed for observation.  # CT angiogram of the chest/abdomen/pelvis showed 1. No thoraco abdominal aortic aneurysm or dissection. 2. No acute findings in the chest, abdomen, or pelvis. 3. Scattered bilateral tiny pulmonary nodules. 4. Cholelithiasis. 5. Small hiatal hernia. 6.  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 Tiny right groin hernia contains only fat. 8. Thoracolumbar scoliosis. 9. Colonic diverticulosis without diverticulitis.  Review of Systems:   General: no fevers, chills, no body weight gain, hass fatigue HEENT: no blurry vision, hearing changes or sore throat Respiratory: no dyspnea, coughing, wheezing CV: no chest pain, no palpitations GI: had nausea and abdominal pain, no vomiting, diarrhea, constipation GU: no dysuria, burning on urination, increased urinary frequency, hematuria  Ext: no leg edema Neuro: no unilateral weakness, numbness, or tingling, no vision change or hearing loss Skin: no rash, no skin tear. MSK: No muscle spasm, no deformity, no limitation of range of movement in spin Heme: No easy bruising.  Travel history: No recent long distant travel.  Allergy: No Known Allergies  Past Medical History:  Diagnosis Date  . GERD (gastroesophageal reflux disease)   . Hypertension     Past Surgical History:  Procedure Laterality Date  . KNEE SURGERY Right   . SHOULDER SURGERY Left     Social History:  reports that he has quit smoking. He has never used smokeless tobacco. He reports that he drinks alcohol. He reports that he does not use drugs.  Family History:  Family History  Problem Relation Age of Onset  . COPD Mother   . Heart attack Father        his father died of heart attack at age of 80. All six of his father's brothers died of heart attack.     Prior to Admission medications   Medication Sig Start Date End Date Taking? Authorizing Provider  hydrochlorothiazide (MICROZIDE) 12.5 MG capsule Take  12.5 mg by mouth daily. 09/21/16  Yes [provider]  losartan (COZAAR) 50 MG tablet Take 50 mg by mouth daily. 11/10/16  Yes [provider]  omeprazole (PRILOSEC) 20 MG capsule Take 1 capsule (20 mg total) by mouth daily. 12/03/16 12/17/16  Duffy Bruce, MD  ranitidine (ZANTAC) 150 MG tablet Take 1 tablet (150 mg  total) by mouth daily. 12/03/16 12/17/16  Duffy Bruce, MD  sucralfate (CARAFATE) 1 g tablet Take 1 tablet (1 g total) by mouth 4 (four) times daily -  with meals and at bedtime. 12/03/16 12/10/16  Duffy Bruce, MD    Physical Exam: Vitals:   12/03/16 2230 12/03/16 2300 12/03/16 2330 12/04/16 0014  BP: (!) 154/74 132/65 124/64 105/65  Pulse: (!) 110 (!) 102 100 94  Resp: 18 (!) 30 (!) 30 18  Temp:    (!) 97.5 F (36.4 C)  TempSrc:    Oral  SpO2: 95% 93% 94% 97%  Weight:    82.7 kg (182 lb 4.8 oz)  Height:    5\' 10"  (1.778 m)   General: Not in acute distress HEENT:       Eyes: PERRL, EOMI, no scleral icterus.       ENT: No discharge from the ears and nose, no pharynx injection, no tonsillar enlargement.        Neck: No JVD, no bruit, no mass felt. Heme: No neck lymph node enlargement. Cardiac: S1/S2, RRR, No murmurs, No gallops or rubs. Respiratory: No rales, wheezing, rhonchi or rubs. GI: Soft, nondistended, nontender, no rebound pain, no organomegaly, BS present. GU: No hematuria Ext: No pitting leg edema bilaterally. 2+DP/PT pulse bilaterally. Musculoskeletal: No joint deformities, No joint redness or warmth, no limitation of ROM in spin. Skin: No rashes.  Neuro: Alert, oriented X3, cranial nerves II-XII grossly intact, moves all extremities normally.  Psych: Patient is not psychotic, no suicidal or hemocidal ideation.  Labs on Admission: I have personally reviewed following labs and imaging studies  CBC:  Recent Labs Lab 12/03/16 1335  WBC 8.8  HGB 16.2  HCT 47.4  MCV 94.2  PLT 202   Basic Metabolic Panel:  Recent Labs Lab 12/03/16 1335  NA 138  K 4.2  CL 102  CO2 28  GLUCOSE 123*  BUN 12  CREATININE 1.04  CALCIUM 9.2   GFR: Estimated Creatinine Clearance: 62.4 mL/min (by C-G formula based on SCr of 1.04 mg/dL). Liver Function Tests:  Recent Labs Lab 12/03/16 1335  AST 56*  ALT 29  ALKPHOS 40  BILITOT 1.6*  PROT 6.3*  ALBUMIN 3.8    Recent  Labs Lab 12/03/16 1335  LIPASE 35   No results for input(s): AMMONIA in the last 168 hours. Coagulation Profile: No results for input(s): INR, PROTIME in the last 168 hours. Cardiac Enzymes:  Recent Labs Lab 12/03/16 2205  TROPONINI <0.03   BNP (last 3 results) No results for input(s): PROBNP in the last 8760 hours. HbA1C: No results for input(s): HGBA1C in the last 72 hours. CBG: No results for input(s): GLUCAP in the last 168 hours. Lipid Profile: No results for input(s): CHOL, HDL, LDLCALC, TRIG, CHOLHDL, LDLDIRECT in the last 72 hours. Thyroid Function Tests: No results for input(s): TSH, T4TOTAL, FREET4, T3FREE, THYROIDAB in the last 72 hours. Anemia Panel: No results for input(s): VITAMINB12, FOLATE, FERRITIN, TIBC, IRON, RETICCTPCT in the last 72 hours. Urine analysis:    Component Value Date/Time   COLORURINE YELLOW 12/03/2016 [place] 12/03/2016 1459  LABSPEC 1.019 12/03/2016 1459   PHURINE 7.0 12/03/2016 1459   GLUCOSEU NEGATIVE 12/03/2016 1459   HGBUR NEGATIVE 12/03/2016 1459   BILIRUBINUR NEGATIVE 12/03/2016 1459   KETONESUR NEGATIVE 12/03/2016 1459   PROTEINUR NEGATIVE 12/03/2016 1459   UROBILINOGEN 1.0 05/03/2008 1656   NITRITE NEGATIVE 12/03/2016 1459   LEUKOCYTESUR NEGATIVE 12/03/2016 1459   Sepsis Labs: @LABRCNTIP (procalcitonin:4,lacticidven:4) )No results found for this or any previous visit (from the past 240 hour(s)).   Radiological Exams on Admission: Ct Angio Chest/abd/pel For Dissection W And/or W/wo  Result Date: 12/03/2016 CLINICAL DATA:  Upper abdominal pain. Chest and back pain. Aortic dissection suspected clinically. EXAM: CT ANGIOGRAPHY CHEST, ABDOMEN AND PELVIS TECHNIQUE: Multidetector CT imaging through the chest, abdomen and pelvis was performed using the standard protocol during bolus administration of intravenous contrast. Multiplanar reconstructed images and MIPs were obtained and reviewed to evaluate the vascular  anatomy. CONTRAST:  100 cc Isovue 370 COMPARISON:  Abdomen and pelvis CT 05/05/2008 FINDINGS: CTA CHEST FINDINGS Cardiovascular: Precontrast imaging shows no hyperdense crescent in the thoracic aorta to suggest acute intramural hematoma. There is no thoracic aortic aneurysm. No dissection of the thoracic aorta. Heart size normal. No pericardial effusion. Minimal atherosclerotic calcification noted in the coronary arteries and thoracic aorta. Mediastinum/Nodes: Scattered small mediastinal lymph nodes without lymphadenopathy. There is no hilar lymphadenopathy. Small hiatal hernia. The esophagus has normal imaging features. There is no axillary lymphadenopathy. Lungs/Pleura: 4 mm right lower lobe pulmonary nodule seen image 78 series 9. 5 mm right middle lobe pulmonary nodule seen image 66 series 9. 4 mm right lower lobe nodule seen on image 68. 4 mm left lower lobe nodule seen on image 80 series 9. Musculoskeletal: Bone windows reveal no worrisome lytic or sclerotic osseous lesions. Review of the MIP images confirms the above findings. CTA ABDOMEN AND PELVIS FINDINGS VASCULAR Aorta: No aneurysm. Widely patent. No dissection. Atherosclerotic change. Celiac: Widely patent. SMA: Widely patent. Renals: Single renal arteries bilaterally with high origin on the right. Both renal arteries are widely patent. IMA: Widely patent. Inflow: Unremarkable. Veins: Portal vein appears patent with laminar flow of un opacified blood. SMV un opacified splenic vein patent. Renal veins patent. IVC un opacified. Review of the MIP images confirms the above findings. NON-VASCULAR Hepatobiliary: No focal abnormality within the liver parenchyma. Layering tiny calcified gallstones evident. No intrahepatic or extrahepatic biliary dilation. Pancreas: No focal mass lesion. No dilatation of the main duct. No intraparenchymal cyst. No peripancreatic edema. Spleen: No splenomegaly. No focal mass lesion. Adrenals/Urinary Tract: No adrenal nodule or  mass. Kidneys unremarkable. No evidence for hydroureter. The urinary bladder appears normal for the degree of distention. Stomach/Bowel: Small hiatal hernia. Stomach otherwise unremarkable. And duodenal diverticuli evident. No small bowel wall thickening. No small bowel dilatation. The terminal ileum is normal. The appendix is normal. Diverticuli are seen scattered along the entire length of the colon without CT findings of diverticulitis. Lymphatic: No abdominal lymphadenopathy. There is no gastrohepatic or hepatoduodenal ligament lymphadenopathy. No intraperitoneal or retroperitoneal lymphadenopathy. Match that No pelvic sidewall lymphadenopathy. Reproductive: Prostate gland unremarkable. Other: No intraperitoneal free fluid. Musculoskeletal: Tiny right groin hernia contains only fat. Bone windows reveal no worrisome lytic or sclerotic osseous lesions. Thoracolumbar scoliosis evident with degenerative changes in the lumbar spine. Review of the MIP images confirms the above findings. IMPRESSION: 1. No thoraco abdominal aortic aneurysm or dissection. 2. No acute findings in the chest, abdomen, or pelvis. 3. Scattered bilateral tiny pulmonary nodules. No follow-up needed if patient is low-risk (and  has no known or suspected primary neoplasm). Non-contrast chest CT can be considered in 12 months if patient is high-risk. This recommendation follows the consensus statement: Guidelines for Management of Incidental Pulmonary Nodules Detected on CT Images: From the Fleischner Society 2017; Radiology 2017; 284:228-243. 4. Cholelithiasis. 5. Small hiatal hernia. 6.  Aortic Atherosclerois (ICD10-170.0) 7. Tiny right groin hernia contains only fat. 8. Thoracolumbar scoliosis. 9. Colonic diverticulosis without diverticulitis. Electronically Signed   By: Misty Stanley M.D.   On: 12/03/2016 19:34     EKG: Independently reviewed. Sinus rhythm, QTC 405, LAD, poor R-wave progression.  Assessment/Plan Principal Problem:    Epigastric abdominal pain Active Problems:   Hypertension   GERD (gastroesophageal reflux disease)   Hypotension   Alcohol use   Epigastric abdominal pain, hypotension and diaphoresis: Etiology is not clear. Patient seems to have had an episode of fainting or vasovagal reaction. His symptom has resolved currently. CT angiogram was negative for aortic dissection. Given strong family history of heart attack, will rule out ACS.  -will place on tele bed for obs -trop x 3 -check A1c, FLP, UDS -ASA  Hypertension: -Continue HCTZ, Cozaar -IVF hydralazine when necessary  GERD: -Protonix  Alcohol use:  -CiWA   DVT ppx: SQ Lovenox Code Status: Full code Family Communication: None at bed side.     Disposition Plan:  Anticipate discharge back to previous home environment Consults called:  none Admission status: Obs / tele    Date of Service 12/04/2016    Ivor Costa [facility] Pager [phone]  If 7PM-7AM, please contact night-coverage [email or web address] Password [email or web address] 12/04/2016, 12:57 AM

## 2016-12-03 NOTE — ED Notes (Signed)
Attempted to call report

## 2016-12-03 NOTE — ED Provider Notes (Signed)
[facility] DEPT Provider Note   CSN: [id number] Arrival date & time: 12/03/16  1327     History   Chief Complaint Chief Complaint  Patient presents with  . Abdominal Pain    HPI VIRAAT VANPATTEN is a 76 y.o. male.  HPI   30 old male with past medical history as below presents with transient abdominal pain. The patient states that he was previous well prior to today. He states that he woke up, worked out, and had no difficulty. He returned to the house. He was walking outside in the heat when he came inside. He walked to sit down when he began to feel very hot and flushed. He reportedly broke out in a sweat and had epigastric abdominal pain with nausea. He has had some mild intermittent epigastric pains but no pains of this severity. He denies any associated shortness of breath. Denies any radiation to the chest or arms. Since then, his symptoms have largely resolved. He went to his urgent care center, who took his blood pressure. He states that it was measured at 70/30, though on preceding and subsequent checks it was in the 150s. He denies any recent medication changes. Per EMS, the patient was normotensive to hypertensive on arrival and has remained so en route. He strongly denies any recent fevers or chills. No other complaints at this time. He is requesting discharge.  Past Medical History:  Diagnosis Date  . GERD (gastroesophageal reflux disease)   . Hypertension     Patient Active Problem List   Diagnosis Date Noted  . Hypotension 12/03/2016  . Alcohol use 12/03/2016  . Hypertension   . GERD (gastroesophageal reflux disease)   . Epigastric abdominal pain     Past Surgical History:  Procedure Laterality Date  . KNEE SURGERY Right   . SHOULDER SURGERY Left        Home Medications    Prior to Admission medications   Medication Sig Start Date End Date Taking? Authorizing Provider  hydrochlorothiazide (MICROZIDE) 12.5 MG capsule Take 12.5 mg by mouth daily.  09/21/16  Yes [provider]  losartan (COZAAR) 50 MG tablet Take 50 mg by mouth daily. 11/10/16  Yes [provider]  omeprazole (PRILOSEC) 20 MG capsule Take 1 capsule (20 mg total) by mouth daily. 12/03/16 12/17/16  Duffy Bruce, MD  ranitidine (ZANTAC) 150 MG tablet Take 1 tablet (150 mg total) by mouth daily. 12/03/16 12/17/16  Duffy Bruce, MD  sucralfate (CARAFATE) 1 g tablet Take 1 tablet (1 g total) by mouth 4 (four) times daily -  with meals and at bedtime. 12/03/16 12/10/16  Duffy Bruce, MD    Family History Family History  Problem Relation Age of Onset  . COPD Mother   . Heart attack Father        his father died of heart attack at age of 82. All six of his father's brothers died of heart attack.    Social History Social History  Substance Use Topics  . Smoking status: Former Research scientist (life sciences)  . Smokeless tobacco: Never Used  . Alcohol use Yes     Allergies   Patient has no known allergies.   Review of Systems Review of Systems  Constitutional: Positive for diaphoresis and fatigue. Negative for chills and fever.  HENT: Negative for congestion and rhinorrhea.   Eyes: Negative for visual disturbance.  Respiratory: Negative for cough, shortness of breath and wheezing.   Cardiovascular: Negative for chest pain and leg swelling.  Gastrointestinal: Positive for  abdominal pain, nausea and vomiting. Negative for diarrhea.  Genitourinary: Negative for dysuria and flank pain.  Musculoskeletal: Negative for neck pain and neck stiffness.  Skin: Negative for rash and wound.  Allergic/Immunologic: Negative for immunocompromised state.  Neurological: Negative for syncope, weakness and headaches.  All other systems reviewed and are negative.    Physical Exam Updated Vital Signs BP (!) 143/67 (BP Location: Right Arm)   Pulse 87   Temp 98.7 F (37.1 C) (Oral)   Resp 18   Ht 5\' 10"  (1.778 m)   Wt 82.7 kg (182 lb 4.8 oz)   SpO2 100%   BMI 26.16 kg/m    Physical Exam  Constitutional: He is oriented to person, place, and time. He appears well-developed and well-nourished. No distress.  HENT:  Head: Normocephalic and atraumatic.  Eyes: Conjunctivae are normal.  Neck: Neck supple.  Cardiovascular: Normal rate, regular rhythm and normal heart sounds.  Exam reveals no friction rub.   No murmur heard. Pulmonary/Chest: Effort normal and breath sounds normal. No respiratory distress. He has no wheezes. He has no rales.  Abdominal: He exhibits no distension.  Musculoskeletal: He exhibits no edema.  Neurological: He is alert and oriented to person, place, and time. He exhibits normal muscle tone.  Skin: Skin is warm. Capillary refill takes less than 2 seconds.  Psychiatric: He has a normal mood and affect.  Nursing note and vitals reviewed.    ED Treatments / Results  Labs (all labs ordered are listed, but only abnormal results are displayed) Labs Reviewed  COMPREHENSIVE METABOLIC PANEL - Abnormal; Notable for the following:       Result Value   Glucose, Bld 123 (*)    Total Protein 6.3 (*)    AST 56 (*)    Total Bilirubin 1.6 (*)    All other components within normal limits  GLUCOSE, CAPILLARY - Abnormal; Notable for the following:    Glucose-Capillary 105 (*)    All other components within normal limits  LIPASE, BLOOD  CBC  URINALYSIS, ROUTINE W REFLEX MICROSCOPIC  BRAIN NATRIURETIC PEPTIDE  TROPONIN I  TROPONIN I  HEMOGLOBIN A1C  LIPID PANEL  RAPID URINE DRUG SCREEN, HOSP PERFORMED  TROPONIN I  BASIC METABOLIC PANEL  CBC  I-STAT TROPONIN, ED  I-STAT CG4 LACTIC ACID, ED    EKG  EKG Interpretation  Date/Time:  Thursday December 03 2016 17:45:28 EDT Ventricular Rate:  80 PR Interval:  172 QRS Duration: 85 QT Interval:  351 QTC Calculation: 405 R Axis:   -77 Text Interpretation:  Sinus rhythm Short PR interval Abnormal R-wave progression, late transition No significant change since last tracing Confirmed by  Duffy Bruce [phone]) on 12/03/2016 5:49:48 PM Also confirmed by Duffy Bruce [phone]), editor Hattie Perch ([id number])  on 12/04/2016 7:02:05 AM       Radiology Ct Angio Chest/abd/pel For Dissection W And/or W/wo  Result Date: 12/03/2016 CLINICAL DATA:  Upper abdominal pain. Chest and back pain. Aortic dissection suspected clinically. EXAM: CT ANGIOGRAPHY CHEST, ABDOMEN AND PELVIS TECHNIQUE: Multidetector CT imaging through the chest, abdomen and pelvis was performed using the standard protocol during bolus administration of intravenous contrast. Multiplanar reconstructed images and MIPs were obtained and reviewed to evaluate the vascular anatomy. CONTRAST:  100 cc Isovue 370 COMPARISON:  Abdomen and pelvis CT 05/05/2008 FINDINGS: CTA CHEST FINDINGS Cardiovascular: Precontrast imaging shows no hyperdense crescent in the thoracic aorta to suggest acute intramural hematoma. There is no thoracic aortic aneurysm. No dissection of the thoracic aorta. Heart  size normal. No pericardial effusion. Minimal atherosclerotic calcification noted in the coronary arteries and thoracic aorta. Mediastinum/Nodes: Scattered small mediastinal lymph nodes without lymphadenopathy. There is no hilar lymphadenopathy. Small hiatal hernia. The esophagus has normal imaging features. There is no axillary lymphadenopathy. Lungs/Pleura: 4 mm right lower lobe pulmonary nodule seen image 78 series 9. 5 mm right middle lobe pulmonary nodule seen image 66 series 9. 4 mm right lower lobe nodule seen on image 68. 4 mm left lower lobe nodule seen on image 80 series 9. Musculoskeletal: Bone windows reveal no worrisome lytic or sclerotic osseous lesions. Review of the MIP images confirms the above findings. CTA ABDOMEN AND PELVIS FINDINGS VASCULAR Aorta: No aneurysm. Widely patent. No dissection. Atherosclerotic change. Celiac: Widely patent. SMA: Widely patent. Renals: Single renal arteries bilaterally with high origin on the right. Both renal  arteries are widely patent. IMA: Widely patent. Inflow: Unremarkable. Veins: Portal vein appears patent with laminar flow of un opacified blood. SMV un opacified splenic vein patent. Renal veins patent. IVC un opacified. Review of the MIP images confirms the above findings. NON-VASCULAR Hepatobiliary: No focal abnormality within the liver parenchyma. Layering tiny calcified gallstones evident. No intrahepatic or extrahepatic biliary dilation. Pancreas: No focal mass lesion. No dilatation of the main duct. No intraparenchymal cyst. No peripancreatic edema. Spleen: No splenomegaly. No focal mass lesion. Adrenals/Urinary Tract: No adrenal nodule or mass. Kidneys unremarkable. No evidence for hydroureter. The urinary bladder appears normal for the degree of distention. Stomach/Bowel: Small hiatal hernia. Stomach otherwise unremarkable. And duodenal diverticuli evident. No small bowel wall thickening. No small bowel dilatation. The terminal ileum is normal. The appendix is normal. Diverticuli are seen scattered along the entire length of the colon without CT findings of diverticulitis. Lymphatic: No abdominal lymphadenopathy. There is no gastrohepatic or hepatoduodenal ligament lymphadenopathy. No intraperitoneal or retroperitoneal lymphadenopathy. Match that No pelvic sidewall lymphadenopathy. Reproductive: Prostate gland unremarkable. Other: No intraperitoneal free fluid. Musculoskeletal: Tiny right groin hernia contains only fat. Bone windows reveal no worrisome lytic or sclerotic osseous lesions. Thoracolumbar scoliosis evident with degenerative changes in the lumbar spine. Review of the MIP images confirms the above findings. IMPRESSION: 1. No thoraco abdominal aortic aneurysm or dissection. 2. No acute findings in the chest, abdomen, or pelvis. 3. Scattered bilateral tiny pulmonary nodules. No follow-up needed if patient is low-risk (and has no known or suspected primary neoplasm). Non-contrast chest CT can be  considered in 12 months if patient is high-risk. This recommendation follows the consensus statement: Guidelines for Management of Incidental Pulmonary Nodules Detected on CT Images: From the Fleischner Society 2017; Radiology 2017; 284:228-243. 4. Cholelithiasis. 5. Small hiatal hernia. 6.  Aortic Atherosclerois (ICD10-170.0) 7. Tiny right groin hernia contains only fat. 8. Thoracolumbar scoliosis. 9. Colonic diverticulosis without diverticulitis. Electronically Signed   By: Misty Stanley M.D.   On: 12/03/2016 19:34    Procedures Procedures (including critical care time)  Medications Ordered in ED Medications  hydrochlorothiazide (MICROZIDE) capsule 12.5 mg (12.5 mg Oral Given 12/04/16 0913)  pantoprazole (PROTONIX) EC tablet 40 mg (40 mg Oral Given 12/04/16 0913)  aspirin tablet 325 mg (325 mg Oral Given 12/03/16 2215)  ondansetron (ZOFRAN) injection 4 mg (not administered)  0.9 %  sodium chloride infusion ( Intravenous New Bag/Given 12/04/16 0535)  enoxaparin (LOVENOX) injection 40 mg (40 mg Subcutaneous Given 12/03/16 2235)  acetaminophen (TYLENOL) tablet 650 mg (not administered)    Or  acetaminophen (TYLENOL) suppository 650 mg (not administered)  hydrALAZINE (APRESOLINE) injection 5 mg (not administered)  zolpidem (AMBIEN) tablet 5 mg (not administered)  losartan (COZAAR) tablet 50 mg (50 mg Oral Given 12/04/16 0913)  LORazepam (ATIVAN) tablet 1 mg (not administered)    Or  LORazepam (ATIVAN) injection 1 mg (not administered)  thiamine (VITAMIN B-1) tablet 100 mg ( Oral See Alternative 12/03/16 2215)    Or  thiamine (B-1) injection 100 mg (100 mg Intravenous Given 11/05/39 6606)  folic acid (FOLVITE) tablet 1 mg (1 mg Oral Given 12/03/16 2215)  multivitamin with minerals tablet 1 tablet (1 tablet Oral Given 12/03/16 2215)  LORazepam (ATIVAN) injection 0-4 mg (0 mg Intravenous Not Given 12/04/16 0524)    Followed by  LORazepam (ATIVAN) injection 0-4 mg (not administered)  MEDLINE mouth rinse (15 mLs  Mouth Rinse Given 12/04/16 1000)  LORazepam (ATIVAN) injection 1 mg (1 mg Intravenous Given 12/03/16 1832)  gi cocktail (Maalox,Lidocaine,Donnatal) (30 mLs Oral Given 12/03/16 1927)  iopamidol (ISOVUE-370) 76 % injection (100 mLs  Contrast Given 12/03/16 1852)     Initial Impression / Assessment and Plan / ED Course  I have reviewed the triage vital signs and the nursing notes.  Pertinent labs & imaging results that were available during my care of the patient were reviewed by me and considered in my medical decision making (see chart for details).     76 year old male with past medical history as above Here with transient episode of acute onset epigastric pain with nausea, and diaphoresis. Differential is broad but primary concern is possible angina given his abrupt onset of diaphoresis in the setting of exertion. Regarding his hypotension, unclear if this was an error in measurement, versus real hypotension which could've been contributed to his diaphoresis. This raises concern of possible transient arrhythmia. Otherwise, he has no fever, normal lactic acid, no signs to suggest sepsis. Chest x-ray is clear. Given his abdominal pain, diaphoresis, and hypertension, CT scan obtained which fortunately shows no evidence of aortic dissection or ruptured aneurysm. Given his risk factors and concerning story, however, will admit for high-risk chest pain evaluation and observation. Otherwise, pt does drink regularly, could be gastritis/PUD. Will give GI cocktail.  Final Clinical Impressions(s) / ED Diagnoses   Final diagnoses:  Epigastric abdominal pain  Essential hypertension  Gastroesophageal reflux disease without esophagitis    New Prescriptions Current Discharge Medication List    START taking these medications   Details  ranitidine (ZANTAC) 150 MG tablet Take 1 tablet (150 mg total) by mouth daily. Qty: 14 tablet, Refills: 0    sucralfate (CARAFATE) 1 g tablet Take 1 tablet (1 g total) by  mouth 4 (four) times daily -  with meals and at bedtime. Qty: 28 tablet, Refills: 0         Duffy Bruce, MD 12/04/16 [phone]

## 2016-12-04 DIAGNOSIS — R1013 Epigastric pain: Secondary | ICD-10-CM | POA: Diagnosis not present

## 2016-12-04 DIAGNOSIS — K219 Gastro-esophageal reflux disease without esophagitis: Principal | ICD-10-CM

## 2016-12-04 DIAGNOSIS — I10 Essential (primary) hypertension: Secondary | ICD-10-CM | POA: Diagnosis not present

## 2016-12-04 DIAGNOSIS — K449 Diaphragmatic hernia without obstruction or gangrene: Secondary | ICD-10-CM | POA: Diagnosis not present

## 2016-12-04 DIAGNOSIS — Z87891 Personal history of nicotine dependence: Secondary | ICD-10-CM | POA: Diagnosis not present

## 2016-12-04 DIAGNOSIS — I959 Hypotension, unspecified: Secondary | ICD-10-CM | POA: Diagnosis not present

## 2016-12-04 DIAGNOSIS — Z79899 Other long term (current) drug therapy: Secondary | ICD-10-CM | POA: Diagnosis not present

## 2016-12-04 DIAGNOSIS — Z7982 Long term (current) use of aspirin: Secondary | ICD-10-CM | POA: Diagnosis not present

## 2016-12-04 DIAGNOSIS — K8020 Calculus of gallbladder without cholecystitis without obstruction: Secondary | ICD-10-CM | POA: Diagnosis not present

## 2016-12-04 DIAGNOSIS — K5730 Diverticulosis of large intestine without perforation or abscess without bleeding: Secondary | ICD-10-CM | POA: Diagnosis not present

## 2016-12-04 LAB — RAPID URINE DRUG SCREEN, HOSP PERFORMED
Amphetamines: NOT DETECTED
BARBITURATES: NOT DETECTED
BENZODIAZEPINES: NOT DETECTED
Cocaine: NOT DETECTED
OPIATES: NOT DETECTED
TETRAHYDROCANNABINOL: NOT DETECTED

## 2016-12-04 LAB — CBC
HEMATOCRIT: 43.8 % (ref 39.0–52.0)
HEMOGLOBIN: 14.9 g/dL (ref 13.0–17.0)
MCH: 32.5 pg (ref 26.0–34.0)
MCHC: 34 g/dL (ref 30.0–36.0)
MCV: 95.4 fL (ref 78.0–100.0)
Platelets: 175 10*3/uL (ref 150–400)
RBC: 4.59 MIL/uL (ref 4.22–5.81)
RDW: 13.2 % (ref 11.5–15.5)
WBC: 9.4 10*3/uL (ref 4.0–10.5)

## 2016-12-04 LAB — TROPONIN I: Troponin I: <0.03 ng/mL (ref <0.03)

## 2016-12-04 LAB — LIPID PANEL
CHOL/HDL RATIO: 2.1 ratio
CHOLESTEROL: 154 mg/dL (ref 0–200)
HDL: 73 mg/dL (ref >40)
LDL Cholesterol: 72 mg/dL (ref 0–99)
Triglycerides: 44 mg/dL (ref <150)
VLDL: 9 mg/dL (ref 0–40)

## 2016-12-04 LAB — BASIC METABOLIC PANEL
ANION GAP: 10 (ref 5–15)
BUN: 13 mg/dL (ref 6–20)
CALCIUM: 8.4 mg/dL — AB (ref 8.9–10.3)
CO2: 24 mmol/L (ref 22–32)
Chloride: 104 mmol/L (ref 101–111)
Creatinine, Ser: 0.88 mg/dL (ref 0.61–1.24)
GFR calc Af Amer: >60 mL/min (ref >60)
Glucose, Bld: 146 mg/dL — ABNORMAL HIGH (ref 65–99)
POTASSIUM: 3.4 mmol/L — AB (ref 3.5–5.1)
Sodium: 138 mmol/L (ref 135–145)

## 2016-12-04 LAB — HEMOGLOBIN A1C
Hgb A1c MFr Bld: 5.2 % (ref 4.8–5.6)
MEAN PLASMA GLUCOSE: 102.54 mg/dL

## 2016-12-04 LAB — GLUCOSE, CAPILLARY: Glucose-Capillary: 105 mg/dL — ABNORMAL HIGH (ref 65–99)

## 2016-12-04 MED ORDER — ORAL CARE MOUTH RINSE
15.0000 mL | Freq: Two times a day (BID) | OROMUCOSAL | Status: DC
  Administered 2016-12-04: 15 mL via OROMUCOSAL

## 2016-12-04 MED ORDER — ASPIRIN EC 81 MG PO TBEC: 81.0000 mg | DELAYED_RELEASE_TABLET | Freq: Every day | ORAL | 0 refills | Status: AC

## 2016-12-04 NOTE — Discharge Summary (Signed)
Physician Discharge Summary  Jerome Pham  JJO:[id number]  DOB: 08-11-1940  DOA: 12/03/2016 PCP: Kathyrn Lass, MD  Admit date: 12/03/2016 Discharge date: 12/04/2016  Admitted From: Home  Disposition: Home   Recommendations for Outpatient Follow-up:  1. Follow up with PCP in 1 week 2. Monitor for symptoms and address if stress test is necessary   Discharge Condition: Stable  CODE STATUS: FULL  Diet recommendation: Heart Healthy   Brief/Interim Summary: Jerome Pham a 76 year old male with medical history significant for hypertension, GERD who presented to the emergency department complaining of epigastric abdominal pain. Patient was seen at urgent care and 1 blood pressure reading was 70/20's. At the urgent care ambulance was called and patient was sent to the ED in route to the emergency department blood pressure was normal 3. Upon his arrival his blood pressure was 176/71. Patient reported he went to the gym in his normal routine, then went to go the golf range and hit some ball without any issues. Subsequently patient had abdominal pain and went to the urgent care. Upon ED evaluation of laboratory workup was unremarkable including troponin negative 3, EKG with no ischemic changes. Lipase and white count was normal and negative urinalysis. CT of the abdomen, with some tiny gall bladder stones. Patient was place in observation for hydration and GERD medications. Patient clinically improved, this pain is felt to be from GERD, and patient was discharge to follow up with PCP.   Subjective: Patient seen and examined, no acute events overnight. Symptoms free at this time. Afebrile. Ambulating without any issues.   Discharge Diagnoses/Hospital Course:  Epigastric pain - due to GERDs Evaluated for ACS - troponin negative, EKG reassuring. BP stable, no typical symptoms of ACS, no further cardiac work up at this time, defer to outpatient evaluation for stress test if deemed to be necessary  CT  abdomen with tiny gallbladder stones. Lipase normal, CXR normal. Discussed with patient regarding GERD medication, patient will like to speak with his PCP for further changes, recommended to switch from PPI to H2 blocker.  Continue omeprazole for now   HTN  BP stable  Continue home medication, no changes were made during hospital stay  Patient started on ASA 81 mg  On the day of the discharge the patient's vitals were stable, and no other acute medical condition were reported by patient. Patient was felt safe to be discharge to home  Discharge Instructions  You were cared for by a hospitalist during your hospital stay. If you have any questions about your discharge medications or the care you received while you were in the hospital after you are discharged, you can call the unit and asked to speak with the hospitalist on call if the hospitalist that took care of you is not available. Once you are discharged, your primary care physician will handle any further medical issues. Please note that NO REFILLS for any discharge medications will be authorized once you are discharged, as it is imperative that you return to your primary care physician (or establish a relationship with a primary care physician if you do not have one) for your aftercare needs so that they can reassess your need for medications and monitor your lab values.  Discharge Instructions    Call MD for:  difficulty breathing, headache or visual disturbances    Complete by:  As directed    Call MD for:  extreme fatigue    Complete by:  As directed    Call MD for:  hives    Complete by:  As directed    Call MD for:  persistant dizziness or light-headedness    Complete by:  As directed    Call MD for:  persistant nausea and vomiting    Complete by:  As directed    Call MD for:  redness, tenderness, or signs of infection (pain, swelling, redness, odor or green/yellow discharge around incision site)    Complete by:  As directed     Call MD for:  severe uncontrolled pain    Complete by:  As directed    Call MD for:  temperature >100.4    Complete by:  As directed    Diet - low sodium heart healthy    Complete by:  As directed    Increase activity slowly    Complete by:  As directed      Allergies as of 12/04/2016   No Known Allergies     Medication List    TAKE these medications   aspirin EC 81 MG tablet Take 1 tablet (81 mg total) by mouth daily.   hydrochlorothiazide 12.5 MG capsule Commonly known as:  MICROZIDE Take 12.5 mg by mouth daily.   losartan 50 MG tablet Commonly known as:  COZAAR Take 50 mg by mouth daily.   omeprazole 20 MG capsule Commonly known as:  PRILOSEC Take 1 capsule (20 mg total) by mouth daily.            Discharge Care Instructions        Start     Ordered   12/04/16 0000  aspirin EC 81 MG tablet  Daily     12/04/16 1011   12/04/16 0000  Increase activity slowly     12/04/16 1011   12/04/16 0000  Diet - low sodium heart healthy     12/04/16 1011   12/04/16 0000  Call MD for:  temperature >100.4     12/04/16 1011   12/04/16 0000  Call MD for:  persistant nausea and vomiting     12/04/16 1011   12/04/16 0000  Call MD for:  severe uncontrolled pain     12/04/16 1011   12/04/16 0000  Call MD for:  redness, tenderness, or signs of infection (pain, swelling, redness, odor or green/yellow discharge around incision site)     12/04/16 1011   12/04/16 0000  Call MD for:  difficulty breathing, headache or visual disturbances     12/04/16 1011   12/04/16 0000  Call MD for:  hives     12/04/16 1011   12/04/16 0000  Call MD for:  persistant dizziness or light-headedness     12/04/16 1011   12/04/16 0000  Call MD for:  extreme fatigue     12/04/16 1011   12/03/16 0000  omeprazole (PRILOSEC) 20 MG capsule  Daily     12/03/16 1751     Follow-up Information    Kathyrn Lass, MD. Schedule an appointment as soon as possible for a visit in 1 week(s).   Specialty:  Family  Medicine Why:  Hospital follow up  Contact information: [place] [address] [phone]          No Known Allergies  Consultations:  None    Procedures/Studies: Ct Angio Chest/abd/pel For Dissection W And/or W/wo  Result Date: 12/03/2016 CLINICAL DATA:  Upper abdominal pain. Chest and back pain. Aortic dissection suspected clinically. EXAM: CT ANGIOGRAPHY CHEST, ABDOMEN AND PELVIS TECHNIQUE: Multidetector CT imaging through the  chest, abdomen and pelvis was performed using the standard protocol during bolus administration of intravenous contrast. Multiplanar reconstructed images and MIPs were obtained and reviewed to evaluate the vascular anatomy. CONTRAST:  100 cc Isovue 370 COMPARISON:  Abdomen and pelvis CT 05/05/2008 FINDINGS: CTA CHEST FINDINGS Cardiovascular: Precontrast imaging shows no hyperdense crescent in the thoracic aorta to suggest acute intramural hematoma. There is no thoracic aortic aneurysm. No dissection of the thoracic aorta. Heart size normal. No pericardial effusion. Minimal atherosclerotic calcification noted in the coronary arteries and thoracic aorta. Mediastinum/Nodes: Scattered small mediastinal lymph nodes without lymphadenopathy. There is no hilar lymphadenopathy. Small hiatal hernia. The esophagus has normal imaging features. There is no axillary lymphadenopathy. Lungs/Pleura: 4 mm right lower lobe pulmonary nodule seen image 78 series 9. 5 mm right middle lobe pulmonary nodule seen image 66 series 9. 4 mm right lower lobe nodule seen on image 68. 4 mm left lower lobe nodule seen on image 80 series 9. Musculoskeletal: Bone windows reveal no worrisome lytic or sclerotic osseous lesions. Review of the MIP images confirms the above findings. CTA ABDOMEN AND PELVIS FINDINGS VASCULAR Aorta: No aneurysm. Widely patent. No dissection. Atherosclerotic change. Celiac: Widely patent. SMA: Widely patent. Renals: Single renal arteries bilaterally with high  origin on the right. Both renal arteries are widely patent. IMA: Widely patent. Inflow: Unremarkable. Veins: Portal vein appears patent with laminar flow of un opacified blood. SMV un opacified splenic vein patent. Renal veins patent. IVC un opacified. Review of the MIP images confirms the above findings. NON-VASCULAR Hepatobiliary: No focal abnormality within the liver parenchyma. Layering tiny calcified gallstones evident. No intrahepatic or extrahepatic biliary dilation. Pancreas: No focal mass lesion. No dilatation of the main duct. No intraparenchymal cyst. No peripancreatic edema. Spleen: No splenomegaly. No focal mass lesion. Adrenals/Urinary Tract: No adrenal nodule or mass. Kidneys unremarkable. No evidence for hydroureter. The urinary bladder appears normal for the degree of distention. Stomach/Bowel: Small hiatal hernia. Stomach otherwise unremarkable. And duodenal diverticuli evident. No small bowel wall thickening. No small bowel dilatation. The terminal ileum is normal. The appendix is normal. Diverticuli are seen scattered along the entire length of the colon without CT findings of diverticulitis. Lymphatic: No abdominal lymphadenopathy. There is no gastrohepatic or hepatoduodenal ligament lymphadenopathy. No intraperitoneal or retroperitoneal lymphadenopathy. Match that No pelvic sidewall lymphadenopathy. Reproductive: Prostate gland unremarkable. Other: No intraperitoneal free fluid. Musculoskeletal: Tiny right groin hernia contains only fat. Bone windows reveal no worrisome lytic or sclerotic osseous lesions. Thoracolumbar scoliosis evident with degenerative changes in the lumbar spine. Review of the MIP images confirms the above findings. IMPRESSION: 1. No thoraco abdominal aortic aneurysm or dissection. 2. No acute findings in the chest, abdomen, or pelvis. 3. Scattered bilateral tiny pulmonary nodules. No follow-up needed if patient is low-risk (and has no known or suspected primary neoplasm).  Non-contrast chest CT can be considered in 12 months if patient is high-risk. This recommendation follows the consensus statement: Guidelines for Management of Incidental Pulmonary Nodules Detected on CT Images: From the Fleischner Society 2017; Radiology 2017; 284:228-243. 4. Cholelithiasis. 5. Small hiatal hernia. 6.  Aortic Atherosclerois (ICD10-170.0) 7. Tiny right groin hernia contains only fat. 8. Thoracolumbar scoliosis. 9. Colonic diverticulosis without diverticulitis. Electronically Signed   By: Misty Stanley M.D.   On: 12/03/2016 19:34   Discharge Exam: Vitals:   12/04/16 0014 12/04/16 0551  BP: 105/65 (!) 143/67  Pulse: 94 87  Resp: 18 18  Temp: (!) 97.5 F (36.4 C) 98.7 F (37.1 C)  SpO2: 97% 100%   Vitals:   12/03/16 2300 12/03/16 2330 12/04/16 0014 12/04/16 0551  BP: 132/65 124/64 105/65 (!) 143/67  Pulse: (!) 102 100 94 87  Resp: (!) 30 (!) 30 18 18   Temp:   (!) 97.5 F (36.4 C) 98.7 F (37.1 C)  TempSrc:   Oral Oral  SpO2: 93% 94% 97% 100%  Weight:   82.7 kg (182 lb 4.8 oz)   Height:   5\' 10"  (1.778 m)     General: Pt is alert, awake, not in acute distress Cardiovascular: RRR, S1/S2 +, no rubs, no gallops Respiratory: CTA bilaterally, no wheezing, no rhonchi Abdominal: Soft, NT, ND, bowel sounds + Extremities: no edema, no cyanosis   The results of significant diagnostics from this hospitalization (including imaging, microbiology, ancillary and laboratory) are listed below for reference.     Microbiology: No results found for this or any previous visit (from the past 240 hour(s)).   Labs: BNP (last 3 results)  Recent Labs  12/03/16 1725  BNP 50.9   Basic Metabolic Panel:  Recent Labs Lab 12/03/16 1335  NA 138  K 4.2  CL 102  CO2 28  GLUCOSE 123*  BUN 12  CREATININE 1.04  CALCIUM 9.2   Liver Function Tests:  Recent Labs Lab 12/03/16 1335  AST 56*  ALT 29  ALKPHOS 40  BILITOT 1.6*  PROT 6.3*  ALBUMIN 3.8    Recent Labs Lab  12/03/16 1335  LIPASE 35   No results for input(s): AMMONIA in the last 168 hours. CBC:  Recent Labs Lab 12/03/16 1335  WBC 8.8  HGB 16.2  HCT 47.4  MCV 94.2  PLT 211   Cardiac Enzymes:  Recent Labs Lab 12/03/16 2205 12/04/16 0541  TROPONINI <0.03 <0.03   BNP: Invalid input(s): POCBNP CBG:  Recent Labs Lab 12/04/16 0750  GLUCAP 105*   D-Dimer No results for input(s): DDIMER in the last 72 hours. Hgb A1c  Recent Labs  12/04/16 0541  HGBA1C 5.2   Lipid Profile  Recent Labs  12/04/16 0541  CHOL 154  HDL 73  LDLCALC 72  TRIG 44  CHOLHDL 2.1   Thyroid function studies No results for input(s): TSH, T4TOTAL, T3FREE, THYROIDAB in the last 72 hours.  Invalid input(s): FREET3 Anemia work up No results for input(s): VITAMINB12, FOLATE, FERRITIN, TIBC, IRON, RETICCTPCT in the last 72 hours. Urinalysis    Component Value Date/Time   COLORURINE YELLOW 12/03/2016 1459   APPEARANCEUR CLEAR 12/03/2016 1459   LABSPEC 1.019 12/03/2016 1459   PHURINE 7.0 12/03/2016 1459   GLUCOSEU NEGATIVE 12/03/2016 1459   HGBUR NEGATIVE 12/03/2016 1459   BILIRUBINUR NEGATIVE 12/03/2016 1459   KETONESUR NEGATIVE 12/03/2016 1459   PROTEINUR NEGATIVE 12/03/2016 1459   UROBILINOGEN 1.0 05/03/2008 1656   NITRITE NEGATIVE 12/03/2016 1459   LEUKOCYTESUR NEGATIVE 12/03/2016 1459   Sepsis Labs Invalid input(s): PROCALCITONIN,  WBC,  LACTICIDVEN Microbiology No results found for this or any previous visit (from the past 240 hour(s)).   Time coordinating discharge: 25 minutes  SIGNED:  Chipper Oman, MD  [facility] 12/04/2016, 10:11 AM  Pager please text page via  [email or web address] Password [email or web address]

## 2016-12-07 DIAGNOSIS — I700 Atherosclerosis of aorta: Secondary | ICD-10-CM | POA: Diagnosis not present

## 2016-12-07 DIAGNOSIS — K22719 Barrett's esophagus with dysplasia, unspecified: Secondary | ICD-10-CM | POA: Diagnosis not present

## 2016-12-07 DIAGNOSIS — I10 Essential (primary) hypertension: Secondary | ICD-10-CM | POA: Diagnosis not present

## 2016-12-07 DIAGNOSIS — R1013 Epigastric pain: Secondary | ICD-10-CM | POA: Diagnosis not present

## 2016-12-07 DIAGNOSIS — K210 Gastro-esophageal reflux disease with esophagitis: Secondary | ICD-10-CM | POA: Diagnosis not present

## 2017-05-16 ENCOUNTER — Emergency Department (HOSPITAL_BASED_OUTPATIENT_CLINIC_OR_DEPARTMENT_OTHER)
Admission: EM | Admit: 2017-05-16 | Discharge: 2017-05-16 | Disposition: A | Discharge status: Home / Self Care | Payer: Medicare HMO | Attending: Emergency Medicine | Admitting: Emergency Medicine

## 2017-05-16 ENCOUNTER — Other Ambulatory Visit: Payer: Self-pay

## 2017-05-16 ENCOUNTER — Encounter (HOSPITAL_BASED_OUTPATIENT_CLINIC_OR_DEPARTMENT_OTHER): Payer: Self-pay | Admitting: Emergency Medicine

## 2017-05-16 DIAGNOSIS — Z87891 Personal history of nicotine dependence: Secondary | ICD-10-CM | POA: Diagnosis not present

## 2017-05-16 DIAGNOSIS — Z79899 Other long term (current) drug therapy: Secondary | ICD-10-CM | POA: Insufficient documentation

## 2017-05-16 DIAGNOSIS — I10 Essential (primary) hypertension: Secondary | ICD-10-CM | POA: Diagnosis not present

## 2017-05-16 DIAGNOSIS — R251 Tremor, unspecified: Secondary | ICD-10-CM | POA: Diagnosis not present

## 2017-05-16 DIAGNOSIS — H81399 Other peripheral vertigo, unspecified ear: Secondary | ICD-10-CM | POA: Diagnosis not present

## 2017-05-16 DIAGNOSIS — R110 Nausea: Secondary | ICD-10-CM | POA: Diagnosis not present

## 2017-05-16 DIAGNOSIS — R42 Dizziness and giddiness: Secondary | ICD-10-CM | POA: Diagnosis not present

## 2017-05-16 LAB — CBC
HCT: 43.2 % (ref 39.0–52.0)
Hemoglobin: 15 g/dL (ref 13.0–17.0)
MCH: 32.5 pg (ref 26.0–34.0)
MCHC: 34.7 g/dL (ref 30.0–36.0)
MCV: 93.5 fL (ref 78.0–100.0)
PLATELETS: 215 10*3/uL (ref 150–400)
RBC: 4.62 MIL/uL (ref 4.22–5.81)
RDW: 12.6 % (ref 11.5–15.5)
WBC: 6.9 10*3/uL (ref 4.0–10.5)

## 2017-05-16 LAB — BASIC METABOLIC PANEL
Anion gap: 7 (ref 5–15)
BUN: 9 mg/dL (ref 6–20)
CALCIUM: 8.8 mg/dL — AB (ref 8.9–10.3)
CHLORIDE: 100 mmol/L — AB (ref 101–111)
CO2: 28 mmol/L (ref 22–32)
CREATININE: 0.8 mg/dL (ref 0.61–1.24)
GFR calc non Af Amer: >60 mL/min (ref >60)
Glucose, Bld: 147 mg/dL — ABNORMAL HIGH (ref 65–99)
Potassium: 3.6 mmol/L (ref 3.5–5.1)
SODIUM: 135 mmol/L (ref 135–145)

## 2017-05-16 LAB — CBG MONITORING, ED: GLUCOSE-CAPILLARY: 132 mg/dL — AB (ref 65–99)

## 2017-05-16 MED ORDER — MECLIZINE HCL 25 MG PO TABS: 25.0000 mg | ORAL_TABLET | Freq: Every day | ORAL | 0 refills | Status: DC

## 2017-05-16 MED ORDER — MECLIZINE HCL 25 MG PO TABS
25.0000 mg | ORAL_TABLET | Freq: Once | ORAL | Status: AC
  Administered 2017-05-16: 25 mg via ORAL
  Filled 2017-05-16: qty 1

## 2017-05-16 NOTE — ED Provider Notes (Signed)
[facility] EMERGENCY DEPARTMENT Provider Note   CSN: [id number] Arrival date & time: 05/16/17  9937     History   Chief Complaint Chief Complaint  Patient presents with  . Dizziness    HPI Jerome Pham is a 77 y.o. male.  Patient is a 77 year old male presenting with dizziness.  PMH significant for HTN, HLD, GERD, and right arm tremor.  Onset Friday evening when patient was lying in bed and positioned himself onto his right side with sudden onset of "flash" at which point the room spinning with spinning and patient felt dizzy.  He repositioned himself onto his left with resolution of symptom.  Patient was in his normal state of health the next morning throughout the day up until the evening when he was again lying in bed on his right side with return of his dizziness.  Symptoms resolved until this morning which point patient was sitting in the chair doing a crossword puzzle and began to feel dizzy again which worsened when he stood up.  Patient is having difficulty walking because he felt unstable.  Patient was brought by son who states patient has been having an awkward gait earlier today.  History of fevers or chills, viral URI symptoms, change in vision, headache, nausea or vomiting, chest pain, shortness of breath, abdominal pain, feelings of syncope or fatigue, motor weakness or sensory loss.  He does endorse 4 days of diarrhea only last week which is now resolved.  He has been able to maintain a normal diet.  He is a former smoker and denies illicit drug use.  Patient drinks approximately 2 beers per day along with occasional wine 7 days/week.  He lives at home alone and drives without needed for assistance with ADLs.  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  She denies sick contacts.  He does not have a previous history of dizziness.      Past Medical History:  Diagnosis Date  . GERD (gastroesophageal reflux disease)   . Hypertension     Patient Active  Problem List   Diagnosis Date Noted  . Hypotension 12/03/2016  . Alcohol use 12/03/2016  . Hypertension   . GERD (gastroesophageal reflux disease)   . Epigastric abdominal pain     Past Surgical History:  Procedure Laterality Date  . KNEE SURGERY Right   . SHOULDER SURGERY Left        Home Medications    Prior to Admission medications   Medication Sig Start Date End Date Taking? Authorizing Provider  hydrochlorothiazide (MICROZIDE) 12.5 MG capsule Take 12.5 mg by mouth daily. 09/21/16  Yes [provider]  losartan (COZAAR) 50 MG tablet Take 50 mg by mouth daily. 11/10/16  Yes [provider]  meclizine (ANTIVERT) 25 MG tablet Take 1 tablet (25 mg total) by mouth daily. 05/16/17   [place] Bing, DO  omeprazole (PRILOSEC) 20 MG capsule Take 1 capsule (20 mg total) by mouth daily. 12/03/16 12/17/16  Duffy Bruce, MD     Family History Family History  Problem Relation Age of Onset  . COPD Mother   . Heart attack Father        his father died of heart attack at age of 72. All six of his father's brothers died of heart attack.    Social History Social History   Tobacco Use  . Smoking status: Former Research scientist (life sciences)  . Smokeless tobacco: Never Used  Substance Use Topics  . Alcohol use: Yes  .  Drug use: No     Allergies   Patient has no known allergies.   Review of Systems Review of Systems  Constitutional: Negative for chills and fever.  HENT: Negative for congestion, ear pain, rhinorrhea and sore throat.   Eyes: Negative for pain and visual disturbance.  Respiratory: Negative for cough and shortness of breath.   Cardiovascular: Negative for chest pain and palpitations.  Gastrointestinal: Positive for nausea. Negative for abdominal pain, diarrhea and vomiting.  Genitourinary: Negative for dysuria and hematuria.  Musculoskeletal: Negative for arthralgias and back pain.  Skin: Negative for color change and rash.  Neurological: Positive for dizziness  and tremors. Negative for seizures, syncope, facial asymmetry, speech difficulty, weakness, light-headedness and headaches.  All other systems reviewed and are negative.    Physical Exam Updated Vital Signs BP 126/69 (BP Location: Left Arm)   Pulse (!) 55   Temp 97.7 F (36.5 C)   Resp 15   SpO2 100%   Physical Exam  Constitutional: He is oriented to person, place, and time. He appears well-developed and well-nourished. No distress.  HENT:  Head: Normocephalic and atraumatic.  Right Ear: External ear normal.  Left Ear: External ear normal.  Eyes: Conjunctivae and EOM are normal. Pupils are equal, round, and reactive to light.  Neck: Normal range of motion. Neck supple.  Cardiovascular: Normal rate and regular rhythm.  No murmur heard. Pulmonary/Chest: Effort normal and breath sounds normal. No respiratory distress.  Abdominal: Soft. There is no tenderness.  Musculoskeletal: He exhibits no edema.  Neurological: He is alert and oriented to person, place, and time. No cranial nerve deficit. Coordination normal.  Skin: Skin is warm and dry. Capillary refill takes less than 2 seconds. He is not diaphoretic.  Psychiatric: He has a normal mood and affect.  Nursing note and vitals reviewed.    ED Treatments / Results  Labs (all labs ordered are listed, but only abnormal results are displayed) Labs Reviewed  BASIC METABOLIC PANEL - Abnormal; Notable for the following components:      Result Value   Chloride 100 (*)    Glucose, Bld 147 (*)    Calcium 8.8 (*)    All other components within normal limits  CBG MONITORING, ED - Abnormal; Notable for the following components:   Glucose-Capillary 132 (*)    All other components within normal limits  CBC    EKG  EKG Interpretation  Date/Time:  Sunday May 16 2017 10:07:25 EST Ventricular Rate:  53 PR Interval:    QRS Duration: 105 QT Interval:  454 QTC Calculation: 427 R Axis:   -58 Text Interpretation:  Sinus rhythm  Left anterior fascicular block Borderline low voltage, extremity leads since previous tracing, rate slower  Confirmed by Theotis Burrow [phone]) on 05/16/2017 10:26:45 AM       Radiology No results found.  Procedures Procedures (including critical care time)  Medications Ordered in ED Medications  meclizine (ANTIVERT) tablet 25 mg (25 mg Oral Given 05/16/17 1115)     Initial Impression / Assessment and Plan / ED Course  I have reviewed the triage vital signs and the nursing notes.  Pertinent labs & imaging results that were available during my care of the patient were reviewed by me and considered in my medical decision making (see chart for details).  Patient is a 77 year old male presenting with dizziness.  PMH significant for HTN, HLD, GERD, and right arm tremor.  Vitals afebrile, mildly bradycardic in high 50s, normotensive 130s/60s, RR 16 at  100% on room air.  EKG sinus bradycardia 53 with LAFB without ST changes or T wave abnormalities.  Patient without bradycardia on prior EKGs as of last year.  No medications which would attribute bradycardia.  CBG 132 on arrival.  CBC and BMET unremarkable.    Patient is well-appearing on exam.  Does have baseline right arm tremor.  No focal deficits on neuro exam.  Lungs clear to auscultation bilaterally.  Sinus bradycardia without irregular rhythm.  Patient is euvolemic.  Orthostatics negative.  Attempted Dix-Hallpike maneuver with returning of symptoms while upright favoring her right side without nystagmus.  We will give trial of meclizine and ambulate patient.  Suspect this may be related to disequilibrium.  Does not appear to be syncopal by history the patient does have new onset bradycardia.   Given meclizine and ambulated in hall 30 minutes following stable gait though slow.  Patient states he felt a little unsteady but was not experiencing vertigo while ambulating.  There did appear to be cerumen buildup bilaterally which was irrigated.   Unlikely central vertigo given intermittent symptoms and lack of vomiting without unstable gait on ambulation.  History currently suggests peripheral vertigo.  Recommending Dix-Hallpike maneuver at home and follow-up with PCP to discuss further workup including possible vestibular rehab.  Will give scription for meclizine in the meantime.  Advised patient to not drink alcohol while on this medication and avoid driving while having vertigo symptoms.  Discussed with patient and son prior to discharge.  Final Clinical Impressions(s) / ED Diagnoses   Final diagnoses:  Peripheral vertigo, unspecified laterality    ED Discharge Orders        Ordered    meclizine (ANTIVERT) 25 MG tablet  Daily     05/16/17 1225       [place] Bing, DO 05/16/17 1227    [place] Bing, DO 05/16/17 1231    Little, Wenda Overland, MD 05/17/17 [phone]

## 2017-05-16 NOTE — ED Triage Notes (Signed)
Pt reports intermittent dizziness since Friday. Pt states he felt normal when he went to bed and woke up at 5:30 with dizziness. Denies weakness in arms or legs, speech clear. Denies pain.

## 2017-05-16 NOTE — Discharge Instructions (Addendum)
You can take the meclizine during attacks to help alleviate the symptoms.  Follow-up with your primary care physician to discuss further workup and consideration for vestibular rehab.  Avoid alcohol while using this medication and driving while you are having this acute flare of your vertigo.

## 2017-05-16 NOTE — ED Notes (Signed)
ED Provider at bedside. 

## 2017-05-21 DIAGNOSIS — R2681 Unsteadiness on feet: Secondary | ICD-10-CM | POA: Diagnosis not present

## 2017-05-21 DIAGNOSIS — R251 Tremor, unspecified: Secondary | ICD-10-CM | POA: Diagnosis not present

## 2017-05-21 DIAGNOSIS — R42 Dizziness and giddiness: Secondary | ICD-10-CM | POA: Diagnosis not present

## 2017-05-28 ENCOUNTER — Other Ambulatory Visit: Payer: Self-pay | Admitting: Family Medicine

## 2017-05-28 DIAGNOSIS — R2681 Unsteadiness on feet: Secondary | ICD-10-CM

## 2017-06-03 ENCOUNTER — Ambulatory Visit
Admission: RE | Admit: 2017-06-03 | Discharge: 2017-06-03 | Disposition: A | Discharge status: Home / Self Care | Payer: Medicare HMO | Source: Ambulatory Visit | Attending: Family Medicine | Admitting: Family Medicine

## 2017-06-03 DIAGNOSIS — R2681 Unsteadiness on feet: Secondary | ICD-10-CM | POA: Diagnosis not present

## 2017-06-03 MED ORDER — GADOBENATE DIMEGLUMINE 529 MG/ML IV SOLN
17.0000 mL | Freq: Once | INTRAVENOUS | Status: AC | PRN
  Administered 2017-06-03: 17 mL via INTRAVENOUS

## 2017-08-13 DIAGNOSIS — Z0000 Encounter for general adult medical examination without abnormal findings: Secondary | ICD-10-CM | POA: Diagnosis not present

## 2017-08-13 DIAGNOSIS — R0981 Nasal congestion: Secondary | ICD-10-CM | POA: Diagnosis not present

## 2017-08-13 DIAGNOSIS — K22719 Barrett's esophagus with dysplasia, unspecified: Secondary | ICD-10-CM | POA: Diagnosis not present

## 2017-08-13 DIAGNOSIS — H9113 Presbycusis, bilateral: Secondary | ICD-10-CM | POA: Diagnosis not present

## 2017-08-13 DIAGNOSIS — H6123 Impacted cerumen, bilateral: Secondary | ICD-10-CM | POA: Diagnosis not present

## 2017-08-13 DIAGNOSIS — K210 Gastro-esophageal reflux disease with esophagitis: Secondary | ICD-10-CM | POA: Diagnosis not present

## 2017-08-13 DIAGNOSIS — I10 Essential (primary) hypertension: Secondary | ICD-10-CM | POA: Diagnosis not present

## 2017-08-13 DIAGNOSIS — I700 Atherosclerosis of aorta: Secondary | ICD-10-CM | POA: Diagnosis not present

## 2017-08-13 DIAGNOSIS — Z1389 Encounter for screening for other disorder: Secondary | ICD-10-CM | POA: Diagnosis not present

## 2017-08-27 DIAGNOSIS — H5213 Myopia, bilateral: Secondary | ICD-10-CM | POA: Diagnosis not present

## 2017-10-18 DIAGNOSIS — R259 Unspecified abnormal involuntary movements: Secondary | ICD-10-CM | POA: Diagnosis not present

## 2017-10-18 DIAGNOSIS — R2681 Unsteadiness on feet: Secondary | ICD-10-CM | POA: Diagnosis not present

## 2017-10-18 DIAGNOSIS — R42 Dizziness and giddiness: Secondary | ICD-10-CM | POA: Diagnosis not present

## 2017-12-23 ENCOUNTER — Ambulatory Visit: Payer: Medicare HMO | Admitting: Neurology

## 2017-12-23 ENCOUNTER — Encounter: Payer: Self-pay | Admitting: Neurology

## 2017-12-23 VITALS — BP 135/75 | HR 63 | Ht 70.5 in | Wt 177.0 lb

## 2017-12-23 DIAGNOSIS — G20 Parkinson's disease: Secondary | ICD-10-CM

## 2017-12-23 NOTE — Patient Instructions (Addendum)
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   Please continue with your healthy lifestyle and exercise.   I do want to suggest a few things today:  Remember to drink plenty of fluid at least 6 glasses (8 oz each), eat healthy meals and do not skip any meals. Try to eat protein with a every meal and eat a healthy snack such as fruit or nuts in between meals. Try to keep a regular sleep-wake schedule and try to exercise daily, particularly in the form of walking, 20-30 minutes a day, if you can.   Try to stay active physically and mentally. Engage in social activities in your community and with your family and try to keep up with current events by reading the newspaper or watching the news. Try to do word puzzles and you may like to do puzzles and brain games on the computer such as on [email or web address]   As far as diagnostic testing, I will order: We will do a brain scan, called MRI and call you with the test results. We will have to schedule you for this on a separate date. This test requires authorization from your insurance, and we will take care of the insurance process.  I would like to see you back in about 6 months, sooner if we need to. Please call us with any interim questions, concerns, problems, updates or refill requests.  Our phone number is [phone]. We also have an after hours call service for urgent matters and there is a physician on-call for urgent questions, that cannot wait till the next work day. For any emergencies you know to call 911 or go to the nearest emergency room.   As discussed, I would like to suggest DaT scan: This is a specialized brain scan designed to help with diagnosis of tremor disorders. A radioactive marker gets injected and the uptake is measured in the brain and compared to normal controls and right side is compared to the left, a change in uptake can help with diagnosis of certain tremor disorders. A  brain MRI on the other hand is a brain scan that helps look at the brain structure in more detail overall and look for age-related changes, blood vessel related changes and look for stroke and volume loss which we call atrophy.   Discuss with Dr. Sabra Heck and let me know, we can do paperwork here for consent at your convenience.

## 2017-12-23 NOTE — Progress Notes (Signed)
Subjective:    Patient ID: Jerome Pham. is a 77 y.o. male.  HPI     Star Age, MD, PhD [facility] [address] [place], [place] [address] Dear Dr. Sabra Heck,   I saw your patient, Jerome Pham, upon your kind request in my neurologic clinic today for initial consultation of his dizziness and tremors. The patient is unaccompanied today. As you know, Jerome Pham is a 77 year old right-handed gentleman with an underlying medical history of hypertension, reflux disease, vitamin D deficiency, and recent episode of vertigo which prompted him to be evaluated in the emergency room in February 2019, mildly overweight state, who is noted to have a hand tremor for the past several months, maybe a year. I reviewed your office records from 10/18/2017, as well as 08/13/2017. He has noticed a tremor in the right hand for the past several months, does not typically bother him, variable in intensity. He has had no recent falls. His most recent episode of vertigo lasted about 2 days and happened about 2 months ago. Prior to that he had an episode of sudden onset of vertigo which also lasted about 48 hours. He was given exercises to do at home which helps. Meclizine did not help very much. He had a brain MRI with and without contrast on 06/03/2017 and I reviewed the results: IMPRESSION: No acute abnormality. No significant ischemia. 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  He is single and lives alone, he is retired. He quit smoking in 1969, drinks alcohol in the form of beer, 2-3 per day on average and one glass of wine. He does not drink caffeine on a regular basis, maybe one cup of unsweet tea. No FHx of ET or PD. Tries to exercise at least 3 days/week, up to 5 days.  Divorced since 2004 from his second wife, has  one son, age 79, in [place].  Has 2 older kids from 51[address] (ex-wife in [place], and daughter and son here as well, ages 45 and 26). He sleeps fairly well. He has nocturia about 2 times per average night, denies any vivid dreams or dream enactments. He has no issues with constipation, tries to eat healthy.  His Past Medical History Is Significant For: Past Medical History:  Diagnosis Date  . Aortic atherosclerosis ([place])   . Barrett's esophagus with dysplasia   . GERD (gastroesophageal reflux disease)   . GERD (gastroesophageal reflux disease)   . Hypertension   . Vitamin D deficiency     His Past Surgical History Is Significant For: Past Surgical History:  Procedure Laterality Date  . CARPAL TUNNEL RELEASE    . KNEE SURGERY Right   . SHOULDER SURGERY Left   . TONSILLECTOMY      His Family History Is Significant For: Family History  Problem Relation Age of Onset  . COPD Mother   . Heart attack Father        his father died of heart attack at age of 62. All six of his father's brothers died of heart attack.    His Social History Is Significant For: Social History   Socioeconomic History  . Marital status: Divorced    Spouse name: Not on file  . Number of children: Not on file  . Years of education: Not on file  . Highest education level: Not on file  Occupational History  . Not on file  Social Needs  . Financial resource strain: Not on file  . Food insecurity:    Worry: Not on file    Inability: Not on file  . Transportation needs:    Medical: Not on file    Non-medical: Not on file  Tobacco Use  . Smoking status: Former Research scientist (life sciences)  . Smokeless tobacco: Never Used  Substance and Sexual Activity  . Alcohol use: Yes  . Drug use: No  . Sexual activity: Not on file  Lifestyle  . Physical activity:    Days per week: Not on file    Minutes per session: Not on file  . Stress: Not on file  Relationships  . Social connections:    Talks on phone: Not on file    Gets  together: Not on file    Attends religious service: Not on file    Active member of club or organization: Not on file    Attends meetings of clubs or organizations: Not on file    Relationship status: Not on file  Other Topics Concern  . Not on file  Social History Narrative  . Not on file    His Allergies Are:  No Known Allergies:   His Current Medications Are:  Outpatient Encounter Medications as of 12/23/2017  Medication Sig  . atorvastatin (LIPITOR) 10 MG tablet Take 10 mg by mouth daily.  . hydrochlorothiazide (MICROZIDE) 12.5 MG capsule Take 12.5 mg by mouth daily.  Marland Kitchen losartan (COZAAR) 50 MG tablet Take 50 mg by mouth daily.  . meclizine (ANTIVERT) 25 MG tablet Take 1 tablet (25 mg total) by mouth daily.  Marland Kitchen omeprazole (PRILOSEC) 20 MG capsule Take 1 capsule (20 mg total) by mouth daily.   No facility-administered encounter medications on file as of 12/23/2017.   :   Review of Systems:  Out of a complete 14 point review of systems, all are reviewed and negative with the exception of these symptoms as listed below: Review of Systems  Neurological:       Pt presents today to discuss his vertigo and his right handed tremor.    Objective:  Neurological Exam  Physical Exam Physical Examination:   Vitals:   12/23/17 1429  BP: 135/75  Pulse: 63   Orthostatic blood pressure and pulse testing was unremarkable without any significant drop or symptoms, no vertiginous symptoms.  General Examination: The patient is a very pleasant 77 y.o. male in no acute distress. He appears well-developed and well-nourished and well groomed.   HEENT: Normocephalic, atraumatic, pupils are equal, round and reactive to light and accommodation. Hearing is grossly intact, bilateral hearing aids in place. Face shows no significant facial masking. He has no lip, neck or jaw tremor. Mild nuchal rigidity is noted. He has on airway examination mild mouth dryness, otherwise nonfocal findings, tongue  protrudes centrally and palate elevates symmetrically. Speech is clear, no dysarthria, no sialorrhea.  Chest: Clear to auscultation without wheezing, rhonchi or crackles noted.  Heart: S1+S2+0, regular and normal without murmurs, rubs or gallops noted.   Abdomen: Soft, non-tender and non-distended with normal bowel sounds appreciated on auscultation.  Extremities: There is no pitting edema in the distal lower extremities bilaterally. Pedal pulses are intact.  Skin: Warm and dry without trophic changes noted.  Musculoskeletal: exam reveals no obvious joint deformities, tenderness or joint swelling or erythema.   Neurologically:  Mental status: The patient is awake, alert and oriented in all 4 spheres. His immediate  and remote memory, attention, language skills and fund of knowledge are appropriate. There is no evidence of aphasia, agnosia, apraxia or anomia. Speech is clear with normal prosody and enunciation. Thought process is linear. Mood is normal and affect is normal.  Cranial nerves II - XII are as described above under HEENT exam. Left shoulder height is a little higher than right.  On 12/23/2017: on Archimedes spiral drawing he has no significant difficulty with the right hand, he has coarse difficulty with the left/nondominant hand, handwriting with his right hand is slightly on the smaller side, legible, not particularly tremulous.  Motor exam: Normal bulk, normal strength for age, mild increase in tone in the right upper extremity with some cogwheeling noted. He has a mild to at times moderate resting tremor in the right upper extremity only, no other resting tremor noted. He has a minimal postural tremor in both upper extremities, no significant action tremor. Fine motor skills show mild difficulty with finger taps and foot taps on the right, otherwise fairly normal findings on the left. He stands without difficulty, posture is age-appropriate to mildly stooped, right shoulder little  lower than left. He walks with fairly good pace and stride length, decreased arm swing and more pronounced tremor on the right upper extremity. He turns well, balance is preserved. Cerebellar testing: No dysmetria or intention tremor on finger to nose testing. There is no truncal or gait ataxia.  Sensory exam: intact to light touch.               Assessment and Plan:   In summary, Jerome Pham. is a very pleasant 77 y.o.-year old male with an underlying medical history of hypertension, reflux disease, vitamin D deficiency, and recent episode of vertigo which prompted him to be evaluated in the emergency room in February 2019, mildly overweight state, whopresents for evaluation of his right hand resting tremor and history of vertigo. On examination, he has no significant orthostatic hypotension or vertigo symptoms. He has done well in that regard. 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 Findings are mild. He had a recent brain MRI with discuss the findings. I would like to proceed with a DaT scan, and I explained the scan to him in detail. He would like to wait until his next appointment with you, which is pending for November. He will get in touch with [place] after that. I did not suggest any new medications for him quite yet. We talked about the importance of healthy lifestyle, good nutrition, good hydration, the importance of sleep and exercise. He is encouraged to limit his alcohol intake and be proactive about constipation issues. For now, I suggested reevaluation in about 6 months, sooner if needed. I answered all his questions today and he was in agreement.  Thank you very much for allowing me to participate in the care of this nice patient. If I can be of any further assistance to you please do not hesitate to call me at [phone].  Sincerely,   Star Age, MD, PhD

## 2018-02-18 DIAGNOSIS — I700 Atherosclerosis of aorta: Secondary | ICD-10-CM | POA: Diagnosis not present

## 2018-02-18 DIAGNOSIS — R251 Tremor, unspecified: Secondary | ICD-10-CM | POA: Diagnosis not present

## 2018-02-18 DIAGNOSIS — I10 Essential (primary) hypertension: Secondary | ICD-10-CM | POA: Diagnosis not present

## 2018-02-18 DIAGNOSIS — K210 Gastro-esophageal reflux disease with esophagitis: Secondary | ICD-10-CM | POA: Diagnosis not present

## 2018-02-18 DIAGNOSIS — Z6826 Body mass index (BMI) 26.0-26.9, adult: Secondary | ICD-10-CM | POA: Diagnosis not present

## 2018-06-22 ENCOUNTER — Telehealth: Payer: Self-pay

## 2018-06-22 NOTE — Telephone Encounter (Signed)
Due to current COVID 19 pandemic, our office is severely reducing in office visits for at least the next 2 weeks, in order to minimize the risk to our patients and healthcare providers.   I called pt and discussed this with him. I offered pt a virtual visit or telephone visit with Dr. Rexene Alberts. Pt declined both of this and would rather delay his appt. Pt reports that he is doing well and does not need anything from [place]. A new appt was made for 09/22/18 at 8:30am. Pt verbalized understanding of new appt date and time.

## 2018-06-23 ENCOUNTER — Ambulatory Visit: Payer: Medicare HMO | Admitting: Neurology

## 2018-09-01 DIAGNOSIS — I10 Essential (primary) hypertension: Secondary | ICD-10-CM | POA: Diagnosis not present

## 2018-09-01 DIAGNOSIS — E559 Vitamin D deficiency, unspecified: Secondary | ICD-10-CM | POA: Diagnosis not present

## 2018-09-01 DIAGNOSIS — R739 Hyperglycemia, unspecified: Secondary | ICD-10-CM | POA: Diagnosis not present

## 2018-09-01 DIAGNOSIS — I700 Atherosclerosis of aorta: Secondary | ICD-10-CM | POA: Diagnosis not present

## 2018-09-01 DIAGNOSIS — Z1159 Encounter for screening for other viral diseases: Secondary | ICD-10-CM | POA: Diagnosis not present

## 2018-09-01 DIAGNOSIS — Z0000 Encounter for general adult medical examination without abnormal findings: Secondary | ICD-10-CM | POA: Diagnosis not present

## 2018-09-01 DIAGNOSIS — Z125 Encounter for screening for malignant neoplasm of prostate: Secondary | ICD-10-CM | POA: Diagnosis not present

## 2018-09-01 DIAGNOSIS — K22719 Barrett's esophagus with dysplasia, unspecified: Secondary | ICD-10-CM | POA: Diagnosis not present

## 2018-09-01 DIAGNOSIS — Z6826 Body mass index (BMI) 26.0-26.9, adult: Secondary | ICD-10-CM | POA: Diagnosis not present

## 2018-09-02 DIAGNOSIS — R251 Tremor, unspecified: Secondary | ICD-10-CM | POA: Diagnosis not present

## 2018-09-02 DIAGNOSIS — K210 Gastro-esophageal reflux disease with esophagitis: Secondary | ICD-10-CM | POA: Diagnosis not present

## 2018-09-02 DIAGNOSIS — K22719 Barrett's esophagus with dysplasia, unspecified: Secondary | ICD-10-CM | POA: Diagnosis not present

## 2018-09-02 DIAGNOSIS — I700 Atherosclerosis of aorta: Secondary | ICD-10-CM | POA: Diagnosis not present

## 2018-09-02 DIAGNOSIS — I10 Essential (primary) hypertension: Secondary | ICD-10-CM | POA: Diagnosis not present

## 2018-09-02 DIAGNOSIS — Z6826 Body mass index (BMI) 26.0-26.9, adult: Secondary | ICD-10-CM | POA: Diagnosis not present

## 2018-09-20 DIAGNOSIS — H524 Presbyopia: Secondary | ICD-10-CM | POA: Diagnosis not present

## 2018-09-22 ENCOUNTER — Ambulatory Visit: Payer: Self-pay | Admitting: Neurology

## 2018-09-29 DIAGNOSIS — K22719 Barrett's esophagus with dysplasia, unspecified: Secondary | ICD-10-CM | POA: Diagnosis not present

## 2018-10-13 DIAGNOSIS — R42 Dizziness and giddiness: Secondary | ICD-10-CM | POA: Diagnosis not present

## 2018-10-13 DIAGNOSIS — Z6825 Body mass index (BMI) 25.0-25.9, adult: Secondary | ICD-10-CM | POA: Diagnosis not present

## 2018-10-27 DIAGNOSIS — R42 Dizziness and giddiness: Secondary | ICD-10-CM | POA: Diagnosis not present

## 2018-10-27 DIAGNOSIS — R262 Difficulty in walking, not elsewhere classified: Secondary | ICD-10-CM | POA: Diagnosis not present

## 2018-10-27 DIAGNOSIS — R2681 Unsteadiness on feet: Secondary | ICD-10-CM | POA: Diagnosis not present

## 2018-10-27 DIAGNOSIS — H8111 Benign paroxysmal vertigo, right ear: Secondary | ICD-10-CM | POA: Diagnosis not present

## 2018-11-01 DIAGNOSIS — H8111 Benign paroxysmal vertigo, right ear: Secondary | ICD-10-CM | POA: Diagnosis not present

## 2018-11-01 DIAGNOSIS — R42 Dizziness and giddiness: Secondary | ICD-10-CM | POA: Diagnosis not present

## 2018-11-01 DIAGNOSIS — R262 Difficulty in walking, not elsewhere classified: Secondary | ICD-10-CM | POA: Diagnosis not present

## 2018-11-01 DIAGNOSIS — R2681 Unsteadiness on feet: Secondary | ICD-10-CM | POA: Diagnosis not present

## 2018-11-03 DIAGNOSIS — R42 Dizziness and giddiness: Secondary | ICD-10-CM | POA: Diagnosis not present

## 2018-11-03 DIAGNOSIS — H8111 Benign paroxysmal vertigo, right ear: Secondary | ICD-10-CM | POA: Diagnosis not present

## 2018-11-03 DIAGNOSIS — R262 Difficulty in walking, not elsewhere classified: Secondary | ICD-10-CM | POA: Diagnosis not present

## 2018-11-03 DIAGNOSIS — R2681 Unsteadiness on feet: Secondary | ICD-10-CM | POA: Diagnosis not present

## 2018-11-14 DIAGNOSIS — Z1159 Encounter for screening for other viral diseases: Secondary | ICD-10-CM | POA: Diagnosis not present

## 2018-11-17 DIAGNOSIS — K449 Diaphragmatic hernia without obstruction or gangrene: Secondary | ICD-10-CM | POA: Diagnosis not present

## 2018-11-17 DIAGNOSIS — K5710 Diverticulosis of small intestine without perforation or abscess without bleeding: Secondary | ICD-10-CM | POA: Diagnosis not present

## 2018-11-17 DIAGNOSIS — K2270 Barrett's esophagus without dysplasia: Secondary | ICD-10-CM | POA: Diagnosis not present

## 2018-11-17 DIAGNOSIS — K317 Polyp of stomach and duodenum: Secondary | ICD-10-CM | POA: Diagnosis not present

## 2018-11-22 DIAGNOSIS — K2270 Barrett's esophagus without dysplasia: Secondary | ICD-10-CM | POA: Diagnosis not present

## 2018-12-29 ENCOUNTER — Encounter: Payer: Self-pay | Admitting: Neurology

## 2018-12-29 ENCOUNTER — Other Ambulatory Visit: Payer: Self-pay

## 2018-12-29 ENCOUNTER — Ambulatory Visit (INDEPENDENT_AMBULATORY_CARE_PROVIDER_SITE_OTHER): Payer: Medicare HMO | Admitting: Neurology

## 2018-12-29 VITALS — BP 138/80 | HR 87 | Ht 70.5 in | Wt 181.0 lb

## 2018-12-29 DIAGNOSIS — G20 Parkinson's disease: Secondary | ICD-10-CM | POA: Diagnosis not present

## 2018-12-29 MED ORDER — CARBIDOPA-LEVODOPA 25-100 MG PO TABS: ORAL_TABLET | ORAL | 5 refills | Status: DC

## 2018-12-29 NOTE — Progress Notes (Signed)
Subjective:    Patient ID: Jerome Pham. is a 78 y.o. male.  HPI     Interim history:   Jerome Pham is a 78 year old right-handed gentleman with an underlying medical history of hypertension, reflux disease, vitamin D deficiency, vertigo (with ER evaluation in February 2019) and mildly overweight state, who presents for follow-up consultation of his parkinsonism.  The patient is unaccompanied today.  He declined a virtual visit in March 2020 and a follow-up from June 2020 was delayed as well. I first met him on 12/23/2017 at the request of his primary care physician, Dr. Kathyrn Lass, at which time the patient reported a several month history, maybe a one-year history of primarily right hand tremor. On examination, he had mild signs of parkinsonism. I suggested we proceed with testing in the form DaT scan. He had had a brain MRI about 6 months prior. He requested to hold off on the nuclear medicine scan and wanted to discuss this with his PCP first.   Today, 12/29/2018:   He reports that his tremor has become worse.  He was encouraged by his primary care physician to make a follow-up appointment.  He has noticed a slight tremor in the left hand but primarily it is in the right hand and has become worse.  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  He does do some longer distance driving.  He went to [place] recently.  He has 2 kids that are local, 1 daughter and 1 son, both have 2 kids each.  His youngest son is in [place], [place] and just got engaged.  He does not drink a whole lot of caffeine, he drinks decaf unsweetened tea mostly during the day, some water.  He denies any issues with constipation.  He has a history of vertigo some 3 years ago and recently had a bout of vertigo.  He was referred to physical therapy by his primary care physician and went 3 times and found it helpful.  He remembers that he was given exercises in  the past and these were reinforced with him this time.  He has extended family in the [place] area where he is originally from.  The patient's allergies, current medications, family history, past medical history, past social history, past surgical history and problem list were reviewed and updated as appropriate.   Previously:  12/23/2017: (He) is noted to have a hand tremor for the past several months, maybe a year. I reviewed your office records from 10/18/2017, as well as 08/13/2017. He has noticed a tremor in the right hand for the past several months, does not typically bother him, variable in intensity. He has had no recent falls. His most recent episode of vertigo lasted about 2 days and happened about 2 months ago. Prior to that he had an episode of sudden onset of vertigo which also lasted about 48 hours. He was given exercises to do at home which helps. Meclizine did not help very much. He had a brain MRI with and without contrast on 06/03/2017 and I reviewed the results: IMPRESSION: No acute abnormality. No significant ischemia. 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   He is single and lives alone, he is retired. He quit smoking in 1969, drinks alcohol in the form of beer, 2-3 per day on average and  one glass of wine. He does not drink caffeine on a regular basis, maybe one cup of unsweet tea. No FHx of ET or PD. Tries to exercise at least 3 days/week, up to 5 days.  Divorced since 2004 from his second wife, has one son, age 25, in [place].  Has 2 older kids from 66[address] (ex-wife in [place], and daughter and son here as well, ages 61 and 70). He sleeps fairly well. He has nocturia about 2 times per average night, denies any vivid dreams or dream enactments. He has no issues with constipation, tries to eat healthy.   His Past Medical History Is  Significant For: Past Medical History:  Diagnosis Date  . Aortic atherosclerosis ([place])   . Barrett's esophagus with dysplasia   . GERD (gastroesophageal reflux disease)   . GERD (gastroesophageal reflux disease)   . Hypertension   . Vitamin D deficiency     His Past Surgical History Is Significant For: Past Surgical History:  Procedure Laterality Date  . CARPAL TUNNEL RELEASE    . KNEE SURGERY Right   . SHOULDER SURGERY Left   . TONSILLECTOMY      His Family History Is Significant For: Family History  Problem Relation Age of Onset  . COPD Mother   . Heart attack Father        his father died of heart attack at age of 35. All six of his father's brothers died of heart attack.    His Social History Is Significant For: Social History   Socioeconomic History  . Marital status: Divorced    Spouse name: Not on file  . Number of children: Not on file  . Years of education: Not on file  . Highest education level: Not on file  Occupational History  . Not on file  Social Needs  . Financial resource strain: Not on file  . Food insecurity    Worry: Not on file    Inability: Not on file  . Transportation needs    Medical: Not on file    Non-medical: Not on file  Tobacco Use  . Smoking status: Former Research scientist (life sciences)  . Smokeless tobacco: Never Used  Substance and Sexual Activity  . Alcohol use: Yes  . Drug use: No  . Sexual activity: Not on file  Lifestyle  . Physical activity    Days per week: Not on file    Minutes per session: Not on file  . Stress: Not on file  Relationships  . Social Herbalist on phone: Not on file    Gets together: Not on file    Attends religious service: Not on file    Active member of club or organization: Not on file    Attends meetings of clubs or organizations: Not on file    Relationship status: Not on file  Other Topics Concern  . Not on file  Social History Narrative  . Not on file    His Allergies Are:  No Known Allergies:    His Current Medications Are:  Outpatient Encounter Medications as of 12/29/2018  Medication Sig  . atorvastatin (LIPITOR) 10 MG tablet Take 10 mg by mouth daily.  . hydrochlorothiazide (MICROZIDE) 12.5 MG capsule Take 12.5 mg by mouth daily.  Marland Kitchen losartan (COZAAR) 50 MG tablet Take 50 mg by mouth daily.  Marland Kitchen omeprazole (PRILOSEC) 10 MG capsule Take 10 mg by mouth daily.  . [EXPIRED] omeprazole (PRILOSEC) 20 MG capsule Take 1 capsule (20 mg total) by  mouth daily.  . [DISCONTINUED] meclizine (ANTIVERT) 25 MG tablet Take 1 tablet (25 mg total) by mouth daily.   No facility-administered encounter medications on file as of 12/29/2018.   :  Review of Systems:  Out of a complete 14 point review of systems, all are reviewed and negative with the exception of these symptoms as listed below: Review of Systems  Neurological:       Pt presents today to discuss his tremor. Pt reports that his tremor has worsened.    Objective:  Neurological Exam  Physical Exam Physical Examination:   Vitals:   12/29/18 0825  BP: 138/80  Pulse: 87    General Examination: The patient is a very pleasant 78 y.o. male in no acute distress. He appears well-developed and well-nourished and well groomed.   HEENT: Normocephalic, atraumatic, pupils are equal, round and reactive to light. Hearing is impaired significantly despite bilateral hearing aids in place. Face shows mild facial masking. He has no lip, neck or jaw tremor. Mild nuchal rigidity is noted. He has on airway examination mild mouth dryness, otherwise nonfocal findings, tongue protrudes centrally and palate elevates symmetrically. Speech is clear, no dysarthria, no sialorrhea.  Chest: Clear to auscultation without wheezing, rhonchi or crackles noted.  Heart: S1+S2+0, regular and normal without murmurs, rubs or gallops noted.   Abdomen: Soft, non-tender and non-distended with normal bowel sounds appreciated on auscultation.  Extremities: There is no  pitting edema in the distal lower extremities bilaterally.   Skin: Warm and dry without trophic changes noted. Chronic age-related changes noted.  Musculoskeletal: exam reveals no obvious joint deformities, tenderness or joint swelling or erythema.   Neurologically:  Mental status: The patient is awake, alert and oriented in all 4 spheres. His immediate and remote memory, attention, language skills and fund of knowledge are appropriate. There is no evidence of aphasia, agnosia, apraxia or anomia. Speech is clear with normal prosody and enunciation. Thought process is linear. Mood is normal and affect is normal.  Cranial nerves II - XII are as described above under HEENT exam. Left shoulder height is a little higher than right.  (On 12/23/2017: on Archimedes spiral drawing he has no significant difficulty with the right hand, he has coarse difficulty with the left/nondominant hand, handwriting with his right hand is slightly on the smaller side, legible, not particularly tremulous.)  Motor exam: Normal bulk, normal strength for age, mild increase in tone in the right upper extremity with some cogwheeling noted. He has a moderate resting tremor in the right upper extremity only, He has a slight and intermittent resting tremor in the left upper extremity.  He has a minimal postural tremor in both upper extremities, no significant action tremor, no intention tremor. Fine motor skills show mild difficulty with finger taps and foot taps on the right, otherwise fairly normal findings on the left. He stands without difficulty, posture is mildly stooped, right shoulder little lower than left. He walks with fairly good pace and stride length, decreased arm swing and more pronounced tremor on the right upper extremity. He turns well, balance is preserved. Cerebellar testing: No dysmetria or intention tremor on finger to nose testing. There is no truncal or gait ataxia.  Sensory exam: intact to light touch.                Assessment and Plan:   In summary, Jerome Pham. is a very pleasant 78 year old male with an underlying medical history of hypertension, reflux disease, vitamin  D deficiency, and recurrent vertigo and mildly overweight state, who presents for evaluation of his right hand resting tremor, After about a year.  He has Noticed progression in the tremor.  He has had some more noticeable findings on examination today.  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  Findings have indeed progressed.  He has heard about Sinemet through a friend.  He is open to trying medication and I suggested generic Sinemet with low dose start and gradual titration.  We talked about expectations and side effects.  He is cautioned about his driving especially longer distance driving.  He reports no problems driving, nevertheless, he has mild fine motor dyscontrol and a more pronounced tremor on the right side.  He is advised to start Sinemet 25-100 mg strength half a tablet twice daily with gradual increase to 1 pill 3 times daily over the next 3 weeks.  He is advised to follow-up in 3 months, sooner if needed.  I answered all his questions today and he was in agreement.

## 2018-12-29 NOTE — Patient Instructions (Signed)
For your mostly right-sided Parkinson symptoms we will start you on Sinemet (generic name: carbidopa-levodopa) 25/100 mg: Take half a pill twice daily (8 AM and noon) for one week, then half a pill 3 times a day (8 AM, noon, and 4 PM) for one week, then one pill 3 times a day thereafter. Please try to take the medication away from you mealtimes, that is, ideally either one hour before or 2 hours after your meal to ensure optimal absorption. The medication can interfere with the protein content of your meal and trying to the protein in your food and therefore not get fully absorbed.  Common side effects reported are: Nausea, vomiting, sedation, confusion, lightheadedness. Rare side effects include hallucinations, severe nausea or vomiting, diarrhea and significant drop in blood pressure especially when going from lying to standing or from sitting to standing.   You can call us anytime for questions or concerns.  I would like to see you back in 3 months.

## 2019-03-18 DIAGNOSIS — S4991XA Unspecified injury of right shoulder and upper arm, initial encounter: Secondary | ICD-10-CM | POA: Diagnosis not present

## 2019-03-21 DIAGNOSIS — M25511 Pain in right shoulder: Secondary | ICD-10-CM | POA: Diagnosis not present

## 2019-04-01 DIAGNOSIS — M25511 Pain in right shoulder: Secondary | ICD-10-CM | POA: Diagnosis not present

## 2019-04-05 DIAGNOSIS — U071 COVID-19: Secondary | ICD-10-CM | POA: Diagnosis not present

## 2019-04-05 DIAGNOSIS — Z1152 Encounter for screening for COVID-19: Secondary | ICD-10-CM | POA: Diagnosis not present

## 2019-04-05 DIAGNOSIS — Z03818 Encounter for observation for suspected exposure to other biological agents ruled out: Secondary | ICD-10-CM | POA: Diagnosis not present

## 2019-04-06 ENCOUNTER — Ambulatory Visit: Payer: Medicare HMO | Admitting: Neurology

## 2019-04-08 DIAGNOSIS — U071 COVID-19: Secondary | ICD-10-CM | POA: Diagnosis not present

## 2019-04-08 DIAGNOSIS — Z9189 Other specified personal risk factors, not elsewhere classified: Secondary | ICD-10-CM | POA: Diagnosis not present

## 2019-05-04 ENCOUNTER — Ambulatory Visit: Payer: Medicare HMO | Admitting: Neurology

## 2019-05-04 ENCOUNTER — Encounter: Payer: Self-pay | Admitting: Neurology

## 2019-05-04 ENCOUNTER — Other Ambulatory Visit: Payer: Self-pay

## 2019-05-04 VITALS — BP 141/77 | HR 97 | Temp 96.9°F | Ht 70.5 in | Wt 179.0 lb

## 2019-05-04 DIAGNOSIS — G20 Parkinson's disease: Secondary | ICD-10-CM

## 2019-05-04 MED ORDER — CARBIDOPA-LEVODOPA 25-100 MG PO TABS: ORAL_TABLET | ORAL | 3 refills | Status: DC

## 2019-05-04 NOTE — Patient Instructions (Signed)
  I am sorry you hurt your shoulder, good luck with the surgery, try to get the Covid vaccine before your shoulder surgery I would say. Please continue with Sinemet 1 pill 3 times daily.  Follow-up in 4 months, try to exercise within your limitation and ask your orthopedic surgeon for guidance regarding your right shoulder, in terms of exercising after surgery.

## 2019-05-04 NOTE — Progress Notes (Signed)
Subjective:    Patient ID: Jerome Pham. is a 79 y.o. male.  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  The patient is unaccompanied today.  I last saw him on 12/29/2018, at which time he reported that his tremor was worse.  He was encouraged to start Sinemet.      Today, 05/04/2019: He reports doing okay with the medication.  He tolerates the Sinemet, takes 1 pill 3 times daily, feels like it helped reduce his tremor, especially in the beginning he noticed a bigger difference.  Unfortunately, he took a fall in December, he was out shopping right before Christmas and tripped over the curb.  He injured his right shoulder and had tendon tears, he needs surgery eventually but was told that surgery will be closer to the end of April.  He tested positive for COVID-19 in January, he had several friends that tested positive and he got tested himself, did not have symptoms that led him to get tested.  He did feel some fatigue and overall unwell for a few days as he recalls.  He manages his ADLs okay, has limited shoulder mobility in terms of lifting his arm up, still drives, still works part-time.  Denies any side effects with the Sinemet, takes it around 5:30 in the morning, 1130 and 330 or 4.   The patient's allergies, current medications, family history, past medical history, past social history, past surgical history and problem list were reviewed and updated as appropriate.    Previously:     He declined a virtual visit in March 2020 and a follow-up from June 2020 was delayed as well. I first met him on 12/23/2017 at the request of his primary care physician, Dr. Kathyrn Lass, at which time the patient reported a several month history,  maybe a one-year history of primarily right hand tremor. On examination, he had mild signs of parkinsonism. I suggested we proceed with testing in the form DaT scan. He had had a brain MRI about 6 months prior. He requested to hold off on the nuclear medicine scan and wanted to discuss this with his PCP first.    12/23/2017: (He) is noted to have a hand tremor for the past several months, maybe a year. I reviewed your office records from 10/18/2017, as well as 08/13/2017. He has noticed a tremor in the right hand for the past several months, does not typically bother him, variable in intensity. He has had no recent falls. His most recent episode of vertigo lasted about 2 days and happened about 2 months ago. Prior to that he had an episode of sudden onset of vertigo which also lasted about 48 hours. He was given exercises to do at home which helps. Meclizine did not help very much. He had a brain MRI with and without contrast on 06/03/2017 and I reviewed the results: IMPRESSION: No acute abnormality. No significant ischemia. 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   He is single and lives alone, he is retired. He quit smoking in 1969, drinks alcohol in the form of beer, 2-3 per day on average and one glass of wine.  He does not drink caffeine on a regular basis, maybe one cup of unsweet tea. No FHx of ET or PD. Tries to exercise at least 3 days/week, up to 5 days.  Divorced since 2004 from his second wife, has one son, age 35, in [place].  Has 2 older kids from 46st (ex-wife in [place], and daughter and son here as well, ages 75 and 75). He sleeps fairly well. He has nocturia about 2 times per average night, denies any vivid dreams or dream enactments. He has no issues with constipation, tries to eat healthy.    His Past Medical History Is Significant  For: Past Medical History:  Diagnosis Date  . Aortic atherosclerosis ([place])   . Barrett's esophagus with dysplasia   . GERD (gastroesophageal reflux disease)   . GERD (gastroesophageal reflux disease)   . Hypertension   . Vitamin D deficiency     His Past Surgical History Is Significant For: Past Surgical History:  Procedure Laterality Date  . CARPAL TUNNEL RELEASE    . KNEE SURGERY Right   . SHOULDER SURGERY Left   . TONSILLECTOMY      His Family History Is Significant For: Family History  Problem Relation Age of Onset  . COPD Mother   . Heart attack Father        his father died of heart attack at age of 64. All six of his father's brothers died of heart attack.    His Social History Is Significant For: Social History   Socioeconomic History  . Marital status: Divorced    Spouse name: Not on file  . Number of children: Not on file  . Years of education: Not on file  . Highest education level: Not on file  Occupational History  . Not on file  Tobacco Use  . Smoking status: Former Research scientist (life sciences)  . Smokeless tobacco: Never Used  Substance and Sexual Activity  . Alcohol use: Yes  . Drug use: No  . Sexual activity: Not on file  Other Topics Concern  . Not on file  Social History Narrative  . Not on file   Social Determinants of Health   Financial Resource Strain:   . Difficulty of Paying Living Expenses: Not on file  Food Insecurity:   . Worried About Charity fundraiser in the Last Year: Not on file  . Ran Out of Food in the Last Year: Not on file  Transportation Needs:   . Lack of Transportation (Medical): Not on file  . Lack of Transportation (Non-Medical): Not on file  Physical Activity:   . Days of Exercise per Week: Not on file  . Minutes of Exercise per Session: Not on file  Stress:   . Feeling of Stress : Not on file  Social Connections:   . Frequency of Communication with Friends and Family: Not on file  . Frequency of Social Gatherings with Friends  and Family: Not on file  . Attends Religious Services: Not on file  . Active Member of Clubs or Organizations: Not on file  . Attends Archivist Meetings: Not on file  . Marital Status: Not on file    His Allergies Are:  No Known Allergies:   His Current Medications Are:  Outpatient Encounter Medications as of 05/04/2019  Medication Sig  . atorvastatin (LIPITOR) 10 MG tablet Take 10 mg by mouth daily.  . carbidopa-levodopa (SINEMET IR) 25-100 MG tablet Take 1/2 pill twice daily x 1 week, then 1/2 pill 3 times  a day x 1 week, then 1 pill 3 times a day thereafter. (Patient taking differently: 1 pill 3 times a day thereafter.)  . hydrochlorothiazide (MICROZIDE) 12.5 MG capsule Take 12.5 mg by mouth daily.  Marland Kitchen losartan (COZAAR) 50 MG tablet Take 50 mg by mouth daily.  Marland Kitchen omeprazole (PRILOSEC) 10 MG capsule Take 10 mg by mouth daily.   No facility-administered encounter medications on file as of 05/04/2019.  :  Review of Systems:  Out of a complete 14 point review of systems, all are reviewed and negative with the exception of these symptoms as listed below:  Review of Systems  Neurological:       Here for f/u on Parkinson's. Pt reports over all he has been doing well. One fall since last visit. Fall took place in Dec injury did occur to right shoulder and he is scheduled to have surgery in April.     Objective:  Neurological Exam  Physical Exam Physical Examination:   Vitals:   05/04/19 0814  BP: (!) 141/77  Pulse: 97  Temp: (!) 96.9 F (36.1 C)    General Examination: The patient is a very pleasant 79 y.o. male in no acute distress. He appears well-developed and well-nourished and well groomed.   HEENT:Normocephalic, atraumatic, pupils are equal, round and reactive to light. Hearing is impaired significantly despite bilateral hearing aids in place. Face shows mild facial masking. He has no lip, neck or jaw tremor. Mild nuchal rigidity is noted. He has on airway  examination mild mouth dryness, otherwise nonfocal findings, tongue protrudes centrally and palate elevates symmetrically. Speech is clear, no dysarthria, no sialorrhea.  Chest:Clear to auscultation without wheezing, rhonchi or crackles noted.  Heart:S1+S2+0, regular and normal without murmurs, rubs or gallops noted.   Abdomen:Soft, non-tender and non-distended with normal bowel sounds appreciated on auscultation.  Extremities:There isnopitting edema in the distal lower extremities bilaterally.   Skin: Warm and dry without trophic changes noted. Chronic age-related changes noted.  Musculoskeletal: exam reveals Limited range of motion in the right shoulder, not able to lift arm above shoulder height.    Neurologically:  Mental status: The patient is awake, alert and oriented in all 4 spheres.Hisimmediate and remote memory, attention, language skills and fund of knowledge are appropriate. There is no evidence of aphasia, agnosia, apraxia or anomia. Speech is clear with normal prosody and enunciation. Thought process is linear. Mood is normaland affect is normal.  Cranial nerves II - XII are as described above under HEENT exam.Left shoulder height is a little higher than right.  (On9/26/2019: on Archimedes spiral drawing he has no significant difficulty with the right hand, he has coarse difficulty with the left/nondominant hand, handwriting with his right hand is slightly on the smaller side, legible, not particularly tremulous.)  Motor exam: Normal bulk,normal strength for age, mild increase in tone in the right upper extremity with some cogwheeling noted. He has a moderate resting tremor in the right upper extremity, a slight and intermittent resting tremor in the left upper extremity.  He has a minimal postural tremor in both upper extremities, no significant action tremor, no intention tremor. Fine motor skills show mild difficulty with finger taps and foot taps on the  right, otherwise fairly normal findings on the left. He stands without difficulty, posture is mildly stooped, right shoulder little lower than left. He walks with fairly good pace and stride length, decreased arm swing and more pronounced tremor on the right upper extremity. He turns well, balance is fairly  well preserved. Cerebellar testing: No dysmetria or intention tremor on finger to nose testing. There is no truncal or gait ataxia.  Sensory exam: intact to light touch.  Assessmentand Plan:   In summary,Roman A Berish Jr.is a very pleasant 60 year oldmalewith an underlying medical history of hypertension, reflux disease, vitamin D deficiency, and recurrent vertigo and mildly overweight state, who presents for Follow-up consultation of his right-sided parkinsonism, he had noticed progression, which prompted him to seek reevaluation next year in October.  He started Sinemet at the time, he tolerates it, feels like it has helped reduce his tremor.  He takes it 3 times a day.  He continues to work part-time.  Unfortunately, he had a significant fall in December and injured his right shoulder.  He will eventually need rotator cuff surgery from what I understand.  He has a positive COVID-19 test in January, thankfully, he did not have a whole lot of symptoms.  I would favor that he get the Covid vaccine before he has his elective surgery, he is planning to seek the vaccination in early April and his surgery may be by late April. We mutually agreed to keep his Sinemet the same, 1 pill 3 times daily, I renewed his prescription for 90 days with refills.  He is advised to follow-up routinely in 4 months, sooner if needed.

## 2019-05-30 DIAGNOSIS — Z01818 Encounter for other preprocedural examination: Secondary | ICD-10-CM | POA: Diagnosis not present

## 2019-05-30 DIAGNOSIS — Z79899 Other long term (current) drug therapy: Secondary | ICD-10-CM | POA: Diagnosis not present

## 2019-05-30 DIAGNOSIS — G20 Parkinson's disease: Secondary | ICD-10-CM | POA: Diagnosis not present

## 2019-05-30 DIAGNOSIS — Z6826 Body mass index (BMI) 26.0-26.9, adult: Secondary | ICD-10-CM | POA: Diagnosis not present

## 2019-05-30 DIAGNOSIS — M19011 Primary osteoarthritis, right shoulder: Secondary | ICD-10-CM | POA: Diagnosis not present

## 2019-05-30 DIAGNOSIS — K2100 Gastro-esophageal reflux disease with esophagitis, without bleeding: Secondary | ICD-10-CM | POA: Diagnosis not present

## 2019-05-30 DIAGNOSIS — I700 Atherosclerosis of aorta: Secondary | ICD-10-CM | POA: Diagnosis not present

## 2019-05-30 DIAGNOSIS — I10 Essential (primary) hypertension: Secondary | ICD-10-CM | POA: Diagnosis not present

## 2019-05-30 DIAGNOSIS — E559 Vitamin D deficiency, unspecified: Secondary | ICD-10-CM | POA: Diagnosis not present

## 2019-06-14 NOTE — Progress Notes (Signed)
Please place surgery orders. Pt scheduled for PAT appt on 06-16-19

## 2019-06-14 NOTE — Progress Notes (Signed)
PCP - Kathyrn Lass, MD Cardiologist -   Chest x-ray -  EKG -  Stress Test -  ECHO -  Cardiac Cath -   Sleep Study -  CPAP -   Fasting Blood Sugar -  Checks Blood Sugar _____ times a day  Blood Thinner Instructions: Aspirin Instructions: Last Dose:  Anesthesia review:   Patient denies shortness of breath, fever, cough and chest pain at PAT appointment   Patient verbalized understanding of instructions that were given to them at the PAT appointment. Patient was also instructed that they will need to review over the PAT instructions again at home before surgery.

## 2019-06-14 NOTE — Patient Instructions (Addendum)
DUE TO COVID-19 ONLY ONE VISITOR IS ALLOWED TO COME WITH YOU AND STAY IN THE WAITING ROOM ONLY DURING PRE OP AND PROCEDURE DAY OF SURGERY. THE 1 VISITOR MAY VISIT WITH YOU AFTER SURGERY IN YOUR PRIVATE ROOM DURING VISITING HOURS ONLY!                 Jerome Pham.  06/14/2019   Your procedure is scheduled on: 06-23-19   Report to [facility] Main  Entrance    Report to Short Stay at 5:30 AM     Call this number if you have problems the morning of surgery [phone]      Remember: Do not eat food or drink liquids :After Midnight.     Take these medicines the morning of surgery with A SIP OF WATER: Atorvastatin, (Lipitor), Carbidopa-Levodopa (Sinemet), and Omeprazole (Prilosec)                                  You may not have any metal on your body including hair pins and              piercings    Do not wear jewelry, cologne, lotions, powders or deodorant                     Men may shave face and neck.   Do not bring valuables to the hospital. [place].  Contacts, dentures or bridgework may not be worn into surgery.  You may bring overnight bag     Special Instructions: N/A              Please read over the following fact sheets you were given: _____________________________________________________________________  [facility]- Preparing for Total Shoulder Arthroplasty    Before surgery, you can play an important role. Because skin is not sterile, your skin needs to be as free of germs as possible. You can reduce the number of germs on your skin by using the following products. . Benzoyl Peroxide Gel o Reduces the number of germs present on the skin o Applied twice a day to shoulder area starting two days before surgery    ==================================================================  Please follow these instructions carefully:  BENZOYL PEROXIDE 5% GEL  Please do not use if you have an allergy  to benzoyl peroxide.   If your skin becomes reddened/irritated stop using the benzoyl peroxide.  Starting two days before surgery, apply as follows: 1. Apply benzoyl peroxide in the morning and at night. Apply after taking a shower. If you are not taking a shower clean entire shoulder front, back, and side along with the armpit with a clean wet washcloth.  2. Place a quarter-sized dollop on your shoulder and rub in thoroughly, making sure to cover the front, back, and side of your shoulder, along with the armpit.   2 days before ____ AM   ____ PM              1 day before ____ AM   ____ PM                         3. Do this twice a day for two days.  (Last application is the night before surgery, AFTER using the CHG soap as described below).  4. Do NOT apply benzoyl peroxide gel on the day of surgery.            [facility] - Preparing for Surgery Before surgery, you can play an important role.  Because skin is not sterile, your skin needs to be as free of germs as possible.  You can reduce the number of germs on your skin by washing with CHG (chlorahexidine gluconate) soap before surgery.  CHG is an antiseptic cleaner which kills germs and bonds with the skin to continue killing germs even after washing. Please DO NOT use if you have an allergy to CHG or antibacterial soaps.  If your skin becomes reddened/irritated stop using the CHG and inform your nurse when you arrive at Short Stay. Do not shave (including legs and underarms) for at least 48 hours prior to the first CHG shower.  You may shave your face/neck. Please follow these instructions carefully:  1.  Shower with CHG Soap the night before surgery and the  morning of Surgery.  2.  If you choose to wash your hair, wash your hair first as usual with your  normal  shampoo.  3.  After you shampoo, rinse your hair and body thoroughly to remove the  shampoo.                           4.  Use CHG as you would any other liquid soap.  You can  apply chg directly  to the skin and wash                       Gently with a scrungie or clean washcloth.  5.  Apply the CHG Soap to your body ONLY FROM THE NECK DOWN.   Do not use on face/ open                           Wound or open sores. Avoid contact with eyes, ears mouth and genitals (private parts).                       Wash face,  Genitals (private parts) with your normal soap.             6.  Wash thoroughly, paying special attention to the area where your surgery  will be performed.  7.  Thoroughly rinse your body with warm water from the neck down.  8.  DO NOT shower/wash with your normal soap after using and rinsing off  the CHG Soap.                9.  Pat yourself dry with a clean towel.            10.  Wear clean pajamas.            11.  Place clean sheets on your bed the night of your first shower and do not  sleep with pets. Day of Surgery : Do not apply any lotions/deodorants the morning of surgery.  Please wear clean clothes to the hospital/surgery center.  FAILURE TO FOLLOW THESE INSTRUCTIONS MAY RESULT IN THE CANCELLATION OF YOUR SURGERY PATIENT SIGNATURE_________________________________  NURSE SIGNATURE__________________________________

## 2019-06-16 ENCOUNTER — Other Ambulatory Visit: Payer: Self-pay

## 2019-06-16 ENCOUNTER — Encounter (HOSPITAL_COMMUNITY)
Admission: RE | Admit: 2019-06-16 | Discharge: 2019-06-16 | Disposition: A | Discharge status: Home / Self Care | Payer: Medicare HMO | Source: Ambulatory Visit | Attending: Orthopedic Surgery | Admitting: Orthopedic Surgery

## 2019-06-16 ENCOUNTER — Encounter (HOSPITAL_COMMUNITY): Payer: Self-pay

## 2019-06-16 DIAGNOSIS — Z01818 Encounter for other preprocedural examination: Secondary | ICD-10-CM | POA: Insufficient documentation

## 2019-06-16 LAB — CBC
HCT: 44.2 % (ref 39.0–52.0)
Hemoglobin: 14.5 g/dL (ref 13.0–17.0)
MCH: 32.3 pg (ref 26.0–34.0)
MCHC: 32.8 g/dL (ref 30.0–36.0)
MCV: 98.4 fL (ref 80.0–100.0)
Platelets: 301 10*3/uL (ref 150–400)
RBC: 4.49 MIL/uL (ref 4.22–5.81)
RDW: 13.3 % (ref 11.5–15.5)
WBC: 6.2 10*3/uL (ref 4.0–10.5)
nRBC: 0 % (ref 0.0–0.2)

## 2019-06-16 LAB — BASIC METABOLIC PANEL
Anion gap: 11 (ref 5–15)
BUN: 13 mg/dL (ref 8–23)
CO2: 27 mmol/L (ref 22–32)
Calcium: 8.8 mg/dL — ABNORMAL LOW (ref 8.9–10.3)
Chloride: 97 mmol/L — ABNORMAL LOW (ref 98–111)
Creatinine, Ser: 0.78 mg/dL (ref 0.61–1.24)
GFR calc Af Amer: >60 mL/min (ref >60)
GFR calc non Af Amer: >60 mL/min (ref >60)
Glucose, Bld: 97 mg/dL (ref 70–99)
Potassium: 4 mmol/L (ref 3.5–5.1)
Sodium: 135 mmol/L (ref 135–145)

## 2019-06-16 LAB — SURGICAL PCR SCREEN
MRSA, PCR: NEGATIVE
Staphylococcus aureus: NEGATIVE

## 2019-06-20 NOTE — H&P (Signed)
Patient's anticipated LOS is less than 2 midnights, meeting these requirements: - Younger than 71 - Lives within 1 hour of care - Has a competent adult at home to recover with post-op recover - NO history of  - Chronic pain requiring opiods  - Diabetes  - Coronary Artery Disease  - Heart failure  - Heart attack  - Stroke  - DVT/VTE  - Cardiac arrhythmia  - Respiratory Failure/COPD  - Renal failure  - Anemia  - Advanced Liver disease       Jerome Pham. is an 79 y.o. male.    Chief Complaint: right shoulder pain  HPI: Pt is a 79 y.o. male complaining of right shoulder pain for multiple years. Pain had continually increased since the beginning. X-rays in the clinic show end-stage arthritic changes of the right shoulder. Pt has tried various conservative treatments which have failed to alleviate their symptoms, including injections and therapy. Various options are discussed with the patient. Risks, benefits and expectations were discussed with the patient. Patient understand the risks, benefits and expectations and wishes to proceed with surgery.   PCP:  Kathyrn Lass, MD  D/C Plans: Home  PMH: Past Medical History:  Diagnosis Date  . Aortic atherosclerosis ([place])   . Barrett's esophagus with dysplasia   . GERD (gastroesophageal reflux disease)   . GERD (gastroesophageal reflux disease)   . Hypertension   . Vitamin D deficiency     PSH: Past Surgical History:  Procedure Laterality Date  . CARPAL TUNNEL RELEASE    . KNEE SURGERY Right   . SHOULDER SURGERY Left   . TONSILLECTOMY      Social History:  reports that he has quit smoking. He has never used smokeless tobacco. He reports current alcohol use of about 3.0 standard drinks of alcohol per week. He reports that he does not use drugs.  Allergies:  No Known Allergies  Medications: No current facility-administered medications for this encounter.   Current Outpatient Medications  Medication Sig Dispense  Refill  . atorvastatin (LIPITOR) 10 MG tablet Take 10 mg by mouth daily.    . carbidopa-levodopa (SINEMET IR) 25-100 MG tablet 1 pill 3 times a day. (Patient taking differently: Take 1 tablet by mouth 3 (three) times daily. ) 270 tablet 3  . hydrochlorothiazide (MICROZIDE) 12.5 MG capsule Take 12.5 mg by mouth daily.    Marland Kitchen losartan (COZAAR) 50 MG tablet Take 50 mg by mouth daily.    Marland Kitchen omeprazole (PRILOSEC) 10 MG capsule Take 20 mg by mouth daily.       No results found for this or any previous visit (from the past 48 hour(s)). No results found.  ROS: Pain with rom of the right upper extremity  Physical Exam: Alert and oriented 79 y.o. male in no acute distress Cranial nerves 2-12 intact Cervical spine: full rom with no tenderness, nv intact distally Chest: active breath sounds bilaterally, no wheeze rhonchi or rales Heart: regular rate and rhythm, no murmur Abd: non tender non distended with active bowel sounds Hip is stable with rom  Right shoulder with limited rom and strength nv intact distally No rashes or edema distally  Assessment/Plan Assessment: right shoulder cuff arthropathy  Plan:  Patient will undergo a right reverse total shoulder by Dr. Veverly Fells at [facility] Risks benefits and expectations were discussed with the patient. Patient understand risks, benefits and expectations and wishes to proceed. Preoperative templating of the joint replacement has been completed, documented, and submitted to the Operating  Room personnel in order to optimize intra-operative equipment management.   Merla Riches PA-C, MPAS [facility] is now [employer] [address] [place], [place], [place] [address] Phone: [phone] [email or web address] Facebook  [employer]

## 2019-06-23 ENCOUNTER — Ambulatory Visit (HOSPITAL_COMMUNITY): Payer: Medicare HMO | Admitting: Anesthesiology

## 2019-06-23 ENCOUNTER — Other Ambulatory Visit: Payer: Self-pay

## 2019-06-23 ENCOUNTER — Encounter (HOSPITAL_COMMUNITY)
Admission: RE | Disposition: A | Discharge status: Home / Self Care | Payer: Self-pay | Source: Home / Self Care | Attending: Orthopedic Surgery

## 2019-06-23 ENCOUNTER — Observation Stay (HOSPITAL_COMMUNITY)
Admission: RE | Admit: 2019-06-23 | Discharge: 2019-06-24 | Disposition: A | Discharge status: Home / Self Care | Payer: Medicare HMO | Attending: Orthopedic Surgery | Admitting: Orthopedic Surgery

## 2019-06-23 ENCOUNTER — Encounter (HOSPITAL_COMMUNITY): Payer: Self-pay | Admitting: Orthopedic Surgery

## 2019-06-23 ENCOUNTER — Observation Stay (HOSPITAL_COMMUNITY): Payer: Medicare HMO

## 2019-06-23 DIAGNOSIS — M75101 Unspecified rotator cuff tear or rupture of right shoulder, not specified as traumatic: Secondary | ICD-10-CM | POA: Diagnosis not present

## 2019-06-23 DIAGNOSIS — Z96611 Presence of right artificial shoulder joint: Secondary | ICD-10-CM | POA: Diagnosis not present

## 2019-06-23 DIAGNOSIS — Z471 Aftercare following joint replacement surgery: Secondary | ICD-10-CM | POA: Diagnosis not present

## 2019-06-23 DIAGNOSIS — I700 Atherosclerosis of aorta: Secondary | ICD-10-CM | POA: Diagnosis not present

## 2019-06-23 DIAGNOSIS — Z79899 Other long term (current) drug therapy: Secondary | ICD-10-CM | POA: Diagnosis not present

## 2019-06-23 DIAGNOSIS — M19011 Primary osteoarthritis, right shoulder: Principal | ICD-10-CM | POA: Insufficient documentation

## 2019-06-23 DIAGNOSIS — Z87891 Personal history of nicotine dependence: Secondary | ICD-10-CM | POA: Diagnosis not present

## 2019-06-23 DIAGNOSIS — K219 Gastro-esophageal reflux disease without esophagitis: Secondary | ICD-10-CM | POA: Diagnosis not present

## 2019-06-23 DIAGNOSIS — I10 Essential (primary) hypertension: Secondary | ICD-10-CM | POA: Diagnosis not present

## 2019-06-23 DIAGNOSIS — G8918 Other acute postprocedural pain: Secondary | ICD-10-CM | POA: Diagnosis not present

## 2019-06-23 HISTORY — PX: REVERSE SHOULDER ARTHROPLASTY: SHX5054

## 2019-06-23 SURGERY — ARTHROPLASTY, SHOULDER, TOTAL, REVERSE
Anesthesia: General | Site: Shoulder | Laterality: Right

## 2019-06-23 MED ORDER — METOCLOPRAMIDE HCL 5 MG PO TABS: 5.0000 mg | ORAL_TABLET | Freq: Three times a day (TID) | ORAL | Status: DC | PRN

## 2019-06-23 MED ORDER — DEXAMETHASONE SODIUM PHOSPHATE 10 MG/ML IJ SOLN
INTRAMUSCULAR | Status: AC
  Filled 2019-06-23: qty 1

## 2019-06-23 MED ORDER — BUPIVACAINE-EPINEPHRINE (PF) 0.5% -1:200000 IJ SOLN
INTRAMUSCULAR | Status: DC | PRN
  Administered 2019-06-23 (×5): 3 mL via PERINEURAL

## 2019-06-23 MED ORDER — TRAMADOL HCL 50 MG PO TABS
50.0000 mg | ORAL_TABLET | Freq: Four times a day (QID) | ORAL | Status: DC | PRN
  Administered 2019-06-24: 50 mg via ORAL
  Filled 2019-06-23: qty 1

## 2019-06-23 MED ORDER — PROPOFOL 10 MG/ML IV BOLUS
INTRAVENOUS | Status: DC | PRN
  Administered 2019-06-23: 150 mg via INTRAVENOUS

## 2019-06-23 MED ORDER — LACTATED RINGERS IV SOLN: INTRAVENOUS | Status: DC

## 2019-06-23 MED ORDER — BUPIVACAINE HCL (PF) 0.25 % IJ SOLN
INTRAMUSCULAR | Status: AC
  Filled 2019-06-23: qty 30

## 2019-06-23 MED ORDER — LOSARTAN POTASSIUM 50 MG PO TABS
50.0000 mg | ORAL_TABLET | Freq: Every day | ORAL | Status: DC
  Administered 2019-06-23 – 2019-06-24 (×2): 50 mg via ORAL
  Filled 2019-06-23 (×2): qty 1

## 2019-06-23 MED ORDER — CARBIDOPA-LEVODOPA 25-100 MG PO TABS
1.0000 | ORAL_TABLET | Freq: Three times a day (TID) | ORAL | Status: DC
  Administered 2019-06-23 – 2019-06-24 (×4): 1 via ORAL
  Filled 2019-06-23 (×4): qty 1

## 2019-06-23 MED ORDER — STERILE WATER FOR IRRIGATION IR SOLN
Status: DC | PRN
  Administered 2019-06-23: 2000 mL

## 2019-06-23 MED ORDER — DOCUSATE SODIUM 100 MG PO CAPS
100.0000 mg | ORAL_CAPSULE | Freq: Two times a day (BID) | ORAL | Status: DC
  Administered 2019-06-24: 100 mg via ORAL
  Filled 2019-06-23: qty 1

## 2019-06-23 MED ORDER — PHENYLEPHRINE HCL-NACL 10-0.9 MG/250ML-% IV SOLN
INTRAVENOUS | Status: DC | PRN
  Administered 2019-06-23: 20 ug/min via INTRAVENOUS

## 2019-06-23 MED ORDER — FENTANYL CITRATE (PF) 100 MCG/2ML IJ SOLN
INTRAMUSCULAR | Status: AC
  Filled 2019-06-23: qty 2

## 2019-06-23 MED ORDER — ONDANSETRON HCL 4 MG/2ML IJ SOLN: 4.0000 mg | Freq: Once | INTRAMUSCULAR | Status: DC | PRN

## 2019-06-23 MED ORDER — PHENYLEPHRINE 40 MCG/ML (10ML) SYRINGE FOR IV PUSH (FOR BLOOD PRESSURE SUPPORT)
PREFILLED_SYRINGE | INTRAVENOUS | Status: DC | PRN
  Administered 2019-06-23 (×3): 80 ug via INTRAVENOUS

## 2019-06-23 MED ORDER — MENTHOL 3 MG MT LOZG: 1.0000 | LOZENGE | OROMUCOSAL | Status: DC | PRN

## 2019-06-23 MED ORDER — HYDROCHLOROTHIAZIDE 12.5 MG PO CAPS
12.5000 mg | ORAL_CAPSULE | Freq: Every day | ORAL | Status: DC
  Administered 2019-06-23 – 2019-06-24 (×2): 12.5 mg via ORAL
  Filled 2019-06-23 (×2): qty 1

## 2019-06-23 MED ORDER — METOCLOPRAMIDE HCL 5 MG/ML IJ SOLN: 5.0000 mg | Freq: Three times a day (TID) | INTRAMUSCULAR | Status: DC | PRN

## 2019-06-23 MED ORDER — ONDANSETRON HCL 4 MG/2ML IJ SOLN: 4.0000 mg | Freq: Four times a day (QID) | INTRAMUSCULAR | Status: DC | PRN

## 2019-06-23 MED ORDER — BUPIVACAINE LIPOSOME 1.3 % IJ SUSP
INTRAMUSCULAR | Status: DC | PRN
  Administered 2019-06-23 (×5): 2 mL via PERINEURAL

## 2019-06-23 MED ORDER — SUGAMMADEX SODIUM 200 MG/2ML IV SOLN
INTRAVENOUS | Status: DC | PRN
  Administered 2019-06-23: 200 mg via INTRAVENOUS

## 2019-06-23 MED ORDER — CEFAZOLIN SODIUM-DEXTROSE 2-4 GM/100ML-% IV SOLN
2.0000 g | Freq: Four times a day (QID) | INTRAVENOUS | Status: AC
  Administered 2019-06-23 – 2019-06-24 (×3): 2 g via INTRAVENOUS
  Filled 2019-06-23 (×3): qty 100

## 2019-06-23 MED ORDER — TRAMADOL HCL 50 MG PO TABS: 50.0000 mg | ORAL_TABLET | Freq: Four times a day (QID) | ORAL | 0 refills | Status: DC | PRN

## 2019-06-23 MED ORDER — CEFAZOLIN SODIUM-DEXTROSE 2-4 GM/100ML-% IV SOLN
INTRAVENOUS | Status: AC
  Filled 2019-06-23: qty 100

## 2019-06-23 MED ORDER — MEPERIDINE HCL 50 MG/ML IJ SOLN: 6.2500 mg | INTRAMUSCULAR | Status: DC | PRN

## 2019-06-23 MED ORDER — ONDANSETRON HCL 4 MG PO TABS: 4.0000 mg | ORAL_TABLET | Freq: Four times a day (QID) | ORAL | Status: DC | PRN

## 2019-06-23 MED ORDER — ROCURONIUM BROMIDE 10 MG/ML (PF) SYRINGE
PREFILLED_SYRINGE | INTRAVENOUS | Status: AC
  Filled 2019-06-23: qty 10

## 2019-06-23 MED ORDER — PHENOL 1.4 % MT LIQD: 1.0000 | OROMUCOSAL | Status: DC | PRN

## 2019-06-23 MED ORDER — LIDOCAINE 2% (20 MG/ML) 5 ML SYRINGE
INTRAMUSCULAR | Status: DC | PRN
  Administered 2019-06-23: 100 mg via INTRAVENOUS

## 2019-06-23 MED ORDER — FENTANYL CITRATE (PF) 100 MCG/2ML IJ SOLN
INTRAMUSCULAR | Status: DC | PRN
  Administered 2019-06-23 (×4): 25 ug via INTRAVENOUS

## 2019-06-23 MED ORDER — ACETAMINOPHEN 10 MG/ML IV SOLN: 1000.0000 mg | Freq: Once | INTRAVENOUS | Status: DC | PRN

## 2019-06-23 MED ORDER — SODIUM CHLORIDE 0.9 % IR SOLN
Status: DC | PRN
  Administered 2019-06-23: 1000 mL

## 2019-06-23 MED ORDER — PHENYLEPHRINE HCL (PRESSORS) 10 MG/ML IV SOLN
INTRAVENOUS | Status: AC
  Filled 2019-06-23: qty 1

## 2019-06-23 MED ORDER — POLYETHYLENE GLYCOL 3350 17 G PO PACK: 17.0000 g | PACK | Freq: Every day | ORAL | Status: DC | PRN

## 2019-06-23 MED ORDER — FENTANYL CITRATE (PF) 100 MCG/2ML IJ SOLN: 25.0000 ug | INTRAMUSCULAR | Status: DC | PRN

## 2019-06-23 MED ORDER — ONDANSETRON HCL 4 MG/2ML IJ SOLN
INTRAMUSCULAR | Status: DC | PRN
  Administered 2019-06-23: 4 mg via INTRAVENOUS

## 2019-06-23 MED ORDER — ONDANSETRON HCL 4 MG/2ML IJ SOLN
INTRAMUSCULAR | Status: AC
  Filled 2019-06-23: qty 2

## 2019-06-23 MED ORDER — CHLORHEXIDINE GLUCONATE 4 % EX LIQD: 60.0000 mL | Freq: Once | CUTANEOUS | Status: DC

## 2019-06-23 MED ORDER — ATORVASTATIN CALCIUM 10 MG PO TABS
10.0000 mg | ORAL_TABLET | Freq: Every day | ORAL | Status: DC
  Administered 2019-06-24: 10 mg via ORAL
  Filled 2019-06-23: qty 1

## 2019-06-23 MED ORDER — LIDOCAINE 2% (20 MG/ML) 5 ML SYRINGE
INTRAMUSCULAR | Status: AC
  Filled 2019-06-23: qty 5

## 2019-06-23 MED ORDER — SODIUM CHLORIDE 0.9 % IV SOLN: INTRAVENOUS | Status: DC

## 2019-06-23 MED ORDER — ROCURONIUM BROMIDE 100 MG/10ML IV SOLN
INTRAVENOUS | Status: DC | PRN
  Administered 2019-06-23: 60 mg via INTRAVENOUS

## 2019-06-23 MED ORDER — CEFAZOLIN SODIUM-DEXTROSE 2-4 GM/100ML-% IV SOLN
2.0000 g | INTRAVENOUS | Status: AC
  Administered 2019-06-23: 2 g via INTRAVENOUS

## 2019-06-23 MED ORDER — BISACODYL 10 MG RE SUPP: 10.0000 mg | Freq: Every day | RECTAL | Status: DC | PRN

## 2019-06-23 MED ORDER — PANTOPRAZOLE SODIUM 40 MG PO TBEC
40.0000 mg | DELAYED_RELEASE_TABLET | Freq: Every day | ORAL | Status: DC
  Administered 2019-06-24: 40 mg via ORAL
  Filled 2019-06-23: qty 1

## 2019-06-23 MED ORDER — DEXAMETHASONE SODIUM PHOSPHATE 10 MG/ML IJ SOLN
INTRAMUSCULAR | Status: DC | PRN
  Administered 2019-06-23: 5 mg via INTRAVENOUS

## 2019-06-23 MED ORDER — PROPOFOL 10 MG/ML IV BOLUS
INTRAVENOUS | Status: AC
  Filled 2019-06-23: qty 20

## 2019-06-23 SURGICAL SUPPLY — 74 items
AID PSTN UNV HD RSTRNT DISP (MISCELLANEOUS) ×1
BAG SPEC THK2 15X12 ZIP CLS (MISCELLANEOUS)
BAG ZIPLOCK 12X15 (MISCELLANEOUS) IMPLANT
BIT DRILL 1.6MX128 (BIT) IMPLANT
BIT DRILL 1.6MX128MM (BIT)
BIT DRILL 170X2.5X (BIT) IMPLANT
BIT DRL 170X2.5X (BIT) ×1
BLADE SAG 18X100X1.27 (BLADE) ×3 IMPLANT
CLOSURE WOUND 1/2 X4 (GAUZE/BANDAGES/DRESSINGS) ×1
COVER BACK TABLE 60X90IN (DRAPES) ×3 IMPLANT
COVER SURGICAL LIGHT HANDLE (MISCELLANEOUS) ×3 IMPLANT
COVER WAND RF STERILE (DRAPES) IMPLANT
CUP HUMERAL 42 PLUS 3 (Orthopedic Implant) ×2 IMPLANT
DECANTER SPIKE VIAL GLASS SM (MISCELLANEOUS) ×3 IMPLANT
DRAPE INCISE IOBAN 66X45 STRL (DRAPES) ×3 IMPLANT
DRAPE ORTHO SPLIT 77X108 STRL (DRAPES) ×6
DRAPE SHEET LG 3/4 BI-LAMINATE (DRAPES) ×3 IMPLANT
DRAPE SURG ORHT 6 SPLT 77X108 (DRAPES) ×2 IMPLANT
DRAPE U-SHAPE 47X51 STRL (DRAPES) ×3 IMPLANT
DRILL 2.5 (BIT) ×3
DRSG ADAPTIC 3X8 NADH LF (GAUZE/BANDAGES/DRESSINGS) ×3 IMPLANT
DRSG PAD ABDOMINAL 8X10 ST (GAUZE/BANDAGES/DRESSINGS) ×3 IMPLANT
DURAPREP 26ML APPLICATOR (WOUND CARE) ×3 IMPLANT
ECCENTRIC EPIPHYSI MODULAR SZ1 (Trauma) IMPLANT
ELECT BLADE TIP CTD 4 INCH (ELECTRODE) ×3 IMPLANT
ELECT NDL TIP 2.8 STRL (NEEDLE) ×1 IMPLANT
ELECT NEEDLE TIP 2.8 STRL (NEEDLE) ×3 IMPLANT
ELECT REM PT RETURN 15FT ADLT (MISCELLANEOUS) ×3 IMPLANT
GAUZE SPONGE 4X4 12PLY STRL (GAUZE/BANDAGES/DRESSINGS) ×3 IMPLANT
GLENOSPHERE XTEND RSA 42 SD +4 (Joint) ×2 IMPLANT
GLOVE BIOGEL PI ORTHO PRO 7.5 (GLOVE) ×2
GLOVE BIOGEL PI ORTHO PRO SZ8 (GLOVE) ×2
GLOVE ORTHO TXT STRL SZ7.5 (GLOVE) ×3 IMPLANT
GLOVE PI ORTHO PRO STRL 7.5 (GLOVE) ×1 IMPLANT
GLOVE PI ORTHO PRO STRL SZ8 (GLOVE) ×1 IMPLANT
GLOVE SURG ORTHO 8.5 STRL (GLOVE) ×3 IMPLANT
GOWN STRL REUS W/TWL XL LVL3 (GOWN DISPOSABLE) ×6 IMPLANT
KIT BASIN OR (CUSTOM PROCEDURE TRAY) ×3 IMPLANT
KIT TURNOVER KIT A (KITS) IMPLANT
MANIFOLD NEPTUNE II (INSTRUMENTS) ×3 IMPLANT
METAGLENE DELTA EXTEND (Trauma) IMPLANT
METAGLENE DXTEND (Trauma) ×3 IMPLANT
MODULAR ECCENTRIC EPIPHYSI SZ1 (Trauma) ×3 IMPLANT
NDL MAYO CATGUT SZ4 TPR NDL (NEEDLE) IMPLANT
NEEDLE MAYO CATGUT SZ4 (NEEDLE) IMPLANT
NS IRRIG 1000ML POUR BTL (IV SOLUTION) ×3 IMPLANT
PACK SHOULDER (CUSTOM PROCEDURE TRAY) ×3 IMPLANT
PENCIL SMOKE EVACUATOR (MISCELLANEOUS) IMPLANT
PIN GUIDE 1.2 (PIN) ×2 IMPLANT
PIN GUIDE GLENOPHERE 1.5MX300M (PIN) ×2 IMPLANT
PIN METAGLENE 2.5 (PIN) ×2 IMPLANT
PROTECTOR NERVE ULNAR (MISCELLANEOUS) ×3 IMPLANT
RESTRAINT HEAD UNIVERSAL NS (MISCELLANEOUS) ×3 IMPLANT
SCREW 4.5X18MM (Screw) ×3 IMPLANT
SCREW 4.5X36MM (Screw) ×2 IMPLANT
SCREW BN 18X4.5XSTRL SHLDR (Screw) IMPLANT
SCREW LOCK 42 (Screw) ×2 IMPLANT
SLING ARM FOAM STRAP LRG (SOFTGOODS) ×2 IMPLANT
SMARTMIX MINI TOWER (MISCELLANEOUS)
SPONGE LAP 4X18 RFD (DISPOSABLE) IMPLANT
STEM 12 HA (Stem) ×2 IMPLANT
STRIP CLOSURE SKIN 1/2X4 (GAUZE/BANDAGES/DRESSINGS) ×2 IMPLANT
SUCTION FRAZIER HANDLE 10FR (MISCELLANEOUS) ×3
SUCTION TUBE FRAZIER 10FR DISP (MISCELLANEOUS) ×1 IMPLANT
SUT FIBERWIRE #2 38 T-5 BLUE (SUTURE) ×6
SUT MNCRL AB 4-0 PS2 18 (SUTURE) ×3 IMPLANT
SUT VIC AB 0 CT1 36 (SUTURE) ×6 IMPLANT
SUT VIC AB 0 CT2 27 (SUTURE) ×3 IMPLANT
SUT VIC AB 2-0 CT1 27 (SUTURE) ×6
SUT VIC AB 2-0 CT1 TAPERPNT 27 (SUTURE) ×1 IMPLANT
SUTURE FIBERWR #2 38 T-5 BLUE (SUTURE) ×2 IMPLANT
TOWEL OR 17X26 10 PK STRL BLUE (TOWEL DISPOSABLE) ×3 IMPLANT
TOWER SMARTMIX MINI (MISCELLANEOUS) IMPLANT
YANKAUER SUCT BULB TIP 10FT TU (MISCELLANEOUS) ×3 IMPLANT

## 2019-06-23 NOTE — Op Note (Signed)
NAME: Jerome Pham, Jerome Pham MEDICAL RECORD [id number] ACCOUNT [id number] DATE OF BIRTH:Mar 01, 1941 FACILITY: [facility] LOCATION: [facility] PHYSICIAN:STEVEN Orlena Sheldon, MD  OPERATIVE REPORT  DATE OF PROCEDURE:  06/23/2019  PREOPERATIVE DIAGNOSIS:  Right shoulder rotator cuff tear arthropathy.  POSTOPERATIVE DIAGNOSIS:  Right shoulder rotator cuff tear arthropathy.  PROCEDURE PERFORMED:  Right reverse total shoulder replacement using DePuy Delta Xtend prosthesis with no subscap repair.  ATTENDING SURGEON:  Esmond Plants, MD  ASSISTANT:  Darol Destine, [place], who was scrubbed during the entire procedure and necessary for satisfactory completion of surgery.  ANESTHESIA:  General anesthesia was used plus interscalene block.  ESTIMATED BLOOD LOSS:  Less than 100 mL.  FLUID REPLACEMENT:  1500 mL crystalloid.  INSTRUMENT COUNTS:  Correct.  COMPLICATIONS:  No complications.  ANTIBIOTICS:  Perioperative antibiotics were given.  INDICATIONS:  The patient is a 79 year old male with a history of worsening right shoulder pain and dysfunction after a hard fall disrupting his rotator cuff and subscapularis.  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  Given his failure to progress with exercises and modification of activity and his pain, we discussed options for management including reverse shoulder replacement to restore fixed focal mechanics to the shoulder.  The patient elected  to proceed with reverse shoulder replacement.  Informed consent obtained.  DESCRIPTION OF PROCEDURE:  After an adequate level of anesthesia was achieved, the patient was positioned in modified beach chair position.  Right shoulder correctly identified and sterilely prepped and draped in the usual manner.  Time-out called,  verifying correct patient, correct side.  We entered the patient's shoulder starting at the coracoid process extending down to the anterior humerus  in a deltopectoral approach.  Dissection down through subcutaneous tissues using Bovie we identified  cephalic vein, took that laterally with the deltoid, pectoralis taken medially.  Conjoined tendon identified and retracted medially.  We placed our deep retractors.  We then tenodesed the biceps in situ with figure-of-8 zero Vicryl suture x2.  We then  released the subscap remnant, which was not repairable and tagged for protection of the axillary nerve and retraction.  We released the inferior capsule progressively externally rotating and delivering the humeral head out of the wound.  We entered the  proximal humerus with a 6 mm reamer, reaming up to a size 12.  We then used the 12 head resection guide to resect the head at 10 degrees of retroversion for the Delta reverse shoulder.  We then removed excess osteophytes with a rongeur.  We subluxed the  humerus posteriorly providing 360 degree exposure of the glenoid face.  We did a capsular labral excision getting good exposure.  We then removed the remaining cartilage with a Cobb elevator, found our center point for our guide pin and then drilled our  guide pin.  We were happy with that placement.  We then reamed for the metaglene baseplate and drilled our central peg hole did our peripheral hand reaming.  We then impacted the metaglene baseplate into position.  We had good bony support for it.  Had a  42 screw locked inferiorly, a 36 locked superiorly, and an 18 nonlocked posteriorly.  We had good baseplate security.  We then inserted a 42+4 standard glenosphere onto the baseplate and secured that with the screwdriver.  I did a finger sweep to make  sure that everything was free from the bearing and then we completed our preparation on the humeral side, reaming for  the 1 right metaphysis and then we inserted that on the 12 stem and impacted that into position and 10 degrees of retroversion.  We then  trialled with a 42+3 poly trial, reduced the shoulder,  and had good stability and full range of motion with no impingement.  We removed all trial components, irrigated thoroughly, and then press-fit the HA coated stem.  It was a 12 stem with a 1 right  modular metaphyseal component set on the 0 setting and placed in 10 degrees of retroversion.  We used an impaction grafting technique with bone graft from the head and had a very stable stem.  We selected a 42+3 real poly, impacted down on the humeral  tray, and then reduced the shoulder.  Had a nice stability on that reduction and then took the shoulder through a full arc of motion.  No gapping with inferior pole or with external rotation, conjoined tendon appropriately tight.  Axillary nerve and not  under too much tension.  We irrigated thoroughly and then repaired the deltopectoral interval with 0 Vicryl suture followed by 2-0 Vicryl for subcutaneous closure and 4-0 Monocryl for skin.  Steri-Strips applied followed by sterile dressing.  The patient  tolerated surgery well.  CN/NUANCE  D:06/23/2019 T:06/23/2019 JOB:[id number]

## 2019-06-23 NOTE — Transfer of Care (Signed)
Immediate Anesthesia Transfer of Care Note  Patient: Jerome Pham.  Procedure(s) Performed: REVERSE SHOULDER ARTHROPLASTY (Right Shoulder)  Patient Location: PACU  Anesthesia Type:General and Regional  Level of Consciousness: drowsy and patient cooperative  Airway & Oxygen Therapy: Patient Spontanous Breathing and Patient connected to face mask oxygen  Post-op Assessment: Report given to RN and Post -op Vital signs reviewed and stable  Post vital signs: Reviewed and stable  Last Vitals:  Vitals Value Taken Time  BP 134/74 06/23/19 0922  Temp    Pulse 79 06/23/19 0924  Resp    SpO2 99 % 06/23/19 0924  Vitals shown include unvalidated device data.  Last Pain:  Vitals:   06/23/19 0616  TempSrc: Oral         Complications: No apparent anesthesia complications

## 2019-06-23 NOTE — Anesthesia Procedure Notes (Signed)
Procedure Name: Intubation Date/Time: 06/23/2019 7:46 AM Performed by: Gwyndolyn Saxon, CRNA Pre-anesthesia Checklist: Patient identified, Emergency Drugs available, Suction available and Patient being monitored Patient Re-evaluated:Patient Re-evaluated prior to induction Oxygen Delivery Method: Circle system utilized Preoxygenation: Pre-oxygenation with 100% oxygen Induction Type: IV induction Ventilation: Mask ventilation without difficulty Laryngoscope Size: Miller and 2 Grade View: Grade I Tube type: Oral Tube size: 7.5 mm Number of attempts: 1 Airway Equipment and Method: Stylet Placement Confirmation: ETT inserted through vocal cords under direct vision,  positive ETCO2 and breath sounds checked- equal and bilateral Secured at: 23 cm Tube secured with: Tape Dental Injury: Teeth and Oropharynx as per pre-operative assessment

## 2019-06-23 NOTE — Brief Op Note (Signed)
06/23/2019  9:15 AM  PATIENT:  Jerome Pham.  79 y.o. male  PRE-OPERATIVE DIAGNOSIS:  Right shoulder cuff arthropathy, end stage  POST-OPERATIVE DIAGNOSIS:  Right shoulder cuff arthropathy, end stage  PROCEDURE:  Procedure(s) with comments: REVERSE SHOULDER ARTHROPLASTY (Right) - interscalene block DePuy Delta Xtend, no subscap repair  SURGEON:  Surgeon(s) and Role:    Netta Cedars, MD - Primary  PHYSICIAN ASSISTANT:   ASSISTANTS: [place] Bruns, PA-C   ANESTHESIA:   regional and general  EBL:  Less than 100 cc  BLOOD ADMINISTERED:none  DRAINS: none   LOCAL MEDICATIONS USED:  NONE  SPECIMEN:  No Specimen  DISPOSITION OF SPECIMEN:  N/A  COUNTS:  YES  TOURNIQUET:  * No tourniquets in log *  DICTATION: .Other Dictation: Dictation Number [id number]  PLAN OF CARE: Admit for overnight observation  PATIENT DISPOSITION:  PACU - hemodynamically stable.   Delay start of Pharmacological VTE agent (>24hrs) due to surgical blood loss or risk of bleeding: not applicable

## 2019-06-23 NOTE — Anesthesia Procedure Notes (Signed)
Anesthesia Regional Block: Interscalene brachial plexus block   Pre-Anesthetic Checklist: ,, timeout performed, Correct Patient, Correct Site, Correct Laterality, Correct Procedure, Correct Position, site marked, Risks and benefits discussed,  Surgical consent,  Pre-op evaluation,  At surgeon's request and post-op pain management  Laterality: Right and Upper  Prep: chloraprep       Needles:  Injection technique: Single-shot  Needle Type: Echogenic Stimulator Needle     Needle Length: 4cm  Needle Gauge: 21   Needle insertion depth: 1 cm   Additional Needles:   Procedures:,,,, ultrasound used (permanent image in chart),,,,  Narrative:  Start time: 06/23/2019 7:00 AM End time: 06/23/2019 7:10 AM Injection made incrementally with aspirations every 5 mL.  Performed by: Personally  Anesthesiologist: Lyn Hollingshead, MD

## 2019-06-23 NOTE — Anesthesia Postprocedure Evaluation (Signed)
Anesthesia Post Note  Patient: Jerome Pham.  Procedure(s) Performed: REVERSE SHOULDER ARTHROPLASTY (Right Shoulder)     Patient location during evaluation: PACU Anesthesia Type: General Level of consciousness: awake Pain management: pain level controlled Vital Signs Assessment: post-procedure vital signs reviewed and stable Respiratory status: spontaneous breathing Cardiovascular status: stable Postop Assessment: no apparent nausea or vomiting Anesthetic complications: no    Last Vitals:  Vitals:   06/23/19 0945 06/23/19 1000  BP: 127/71 (!) 110/92  Pulse: 71 79  Resp: 20 19  Temp:    SpO2: 100% 95%    Last Pain:  Vitals:   06/23/19 0945  TempSrc:   PainSc: 0-No pain   Pain Goal:                   Huston Foley

## 2019-06-23 NOTE — Anesthesia Preprocedure Evaluation (Addendum)
Anesthesia Evaluation  Patient identified by MRN, date of birth, ID band Patient awake    Reviewed: Allergy & Precautions, NPO status , Patient's Chart, lab work & pertinent test results  Airway Mallampati: I       Dental no notable dental hx. (+) Teeth Intact   Pulmonary former smoker,    Pulmonary exam normal breath sounds clear to auscultation       Cardiovascular hypertension, Pt. on medications Normal cardiovascular exam Rhythm:Regular Rate:Normal     Neuro/Psych negative neurological ROS  negative psych ROS   GI/Hepatic Neg liver ROS, GERD  Medicated and Controlled,  Endo/Other  negative endocrine ROS  Renal/GU negative Renal ROS  negative genitourinary   Musculoskeletal negative musculoskeletal ROS (+)   Abdominal Normal abdominal exam  (+)   Peds  Hematology negative hematology ROS (+)   Anesthesia Other Findings   Reproductive/Obstetrics                             Anesthesia Physical Anesthesia Plan  ASA: II  Anesthesia Plan: General   Post-op Pain Management:  Regional for Post-op pain   Induction: Intravenous  PONV Risk Score and Plan: 2 and Ondansetron and Dexamethasone  Airway Management Planned: Oral ETT  Additional Equipment: None  Intra-op Plan:   Post-operative Plan: Extubation in OR  Informed Consent: I have reviewed the patients History and Physical, chart, labs and discussed the procedure including the risks, benefits and alternatives for the proposed anesthesia with the patient or authorized representative who has indicated his/her understanding and acceptance.     Dental advisory given  Plan Discussed with: CRNA  Anesthesia Plan Comments:         Anesthesia Quick Evaluation

## 2019-06-23 NOTE — Discharge Instructions (Signed)
Ice to the shoulder constantly.  Keep the incision covered and clean and dry for one week, then ok to get it wet in the shower. ° °Do exercise as instructed several times per day. ° °DO NOT reach behind your back or push up out of a chair with the operative arm. ° °Use a sling while you are up and around for comfort, may remove while seated.  Keep pillow propped behind the operative elbow. ° °Follow up with Dr Salem Lembke in two weeks in the office, call [phone] for appt °

## 2019-06-23 NOTE — Interval H&P Note (Signed)
History and Physical Interval Note:  06/23/2019 7:29 AM  Jerome Pham.  has presented today for surgery, with the diagnosis of Right shoulder cuff arthropathy.  The various methods of treatment have been discussed with the patient and family. After consideration of risks, benefits and other options for treatment, the patient has consented to  Procedure(s) with comments: REVERSE SHOULDER ARTHROPLASTY (Right) - interscalene block as a surgical intervention.  The patient's history has been reviewed, patient examined, no change in status, stable for surgery.  I have reviewed the patient's chart and labs.  Questions were answered to the patient's satisfaction.     [name]

## 2019-06-24 DIAGNOSIS — Z87891 Personal history of nicotine dependence: Secondary | ICD-10-CM | POA: Diagnosis not present

## 2019-06-24 DIAGNOSIS — I10 Essential (primary) hypertension: Secondary | ICD-10-CM | POA: Diagnosis not present

## 2019-06-24 DIAGNOSIS — K219 Gastro-esophageal reflux disease without esophagitis: Secondary | ICD-10-CM | POA: Diagnosis not present

## 2019-06-24 DIAGNOSIS — M75101 Unspecified rotator cuff tear or rupture of right shoulder, not specified as traumatic: Secondary | ICD-10-CM | POA: Diagnosis not present

## 2019-06-24 DIAGNOSIS — I700 Atherosclerosis of aorta: Secondary | ICD-10-CM | POA: Diagnosis not present

## 2019-06-24 DIAGNOSIS — M19011 Primary osteoarthritis, right shoulder: Secondary | ICD-10-CM | POA: Diagnosis not present

## 2019-06-24 DIAGNOSIS — Z79899 Other long term (current) drug therapy: Secondary | ICD-10-CM | POA: Diagnosis not present

## 2019-06-24 LAB — BASIC METABOLIC PANEL
Anion gap: 10 (ref 5–15)
BUN: 12 mg/dL (ref 8–23)
CO2: 27 mmol/L (ref 22–32)
Calcium: 8.2 mg/dL — ABNORMAL LOW (ref 8.9–10.3)
Chloride: 94 mmol/L — ABNORMAL LOW (ref 98–111)
Creatinine, Ser: 0.76 mg/dL (ref 0.61–1.24)
GFR calc Af Amer: >60 mL/min (ref >60)
GFR calc non Af Amer: >60 mL/min (ref >60)
Glucose, Bld: 134 mg/dL — ABNORMAL HIGH (ref 70–99)
Potassium: 3.2 mmol/L — ABNORMAL LOW (ref 3.5–5.1)
Sodium: 131 mmol/L — ABNORMAL LOW (ref 135–145)

## 2019-06-24 LAB — HEMOGLOBIN AND HEMATOCRIT, BLOOD
HCT: 37.8 % — ABNORMAL LOW (ref 39.0–52.0)
Hemoglobin: 12.7 g/dL — ABNORMAL LOW (ref 13.0–17.0)

## 2019-06-24 NOTE — Progress Notes (Signed)
Orthopedics Progress Note  Subjective: Patient feeling well this AM and ready for D/C  Objective:  Vitals:   06/24/19 0125 06/24/19 0617  BP: 134/68 134/69  Pulse: 68 72  Resp: 14 15  Temp: 98.7 F (37.1 C) 98.5 F (36.9 C)  SpO2: 98% 97%    General: Awake and alert  Musculoskeletal: Right shoulder dressing changed. No pain with gentle AROM Neurovascularly intact  Lab Results  Component Value Date   WBC 6.2 06/16/2019   HGB 12.7 (L) 06/24/2019   HCT 37.8 (L) 06/24/2019   MCV 98.4 06/16/2019   PLT 301 06/16/2019       Component Value Date/Time   NA 131 (L) 06/24/2019 0340   K 3.2 (L) 06/24/2019 0340   CL 94 (L) 06/24/2019 0340   CO2 27 06/24/2019 0340   GLUCOSE 134 (H) 06/24/2019 0340   BUN 12 06/24/2019 0340   CREATININE 0.76 06/24/2019 0340   CALCIUM 8.2 (L) 06/24/2019 0340   GFRNONAA >60 06/24/2019 0340   GFRAA >60 06/24/2019 0340    Lab Results  Component Value Date   INR 1.2 05/04/2008    Assessment/Plan: POD #1 s/p Procedure(s): REVERSE SHOULDER ARTHROPLASTY Activity as tolerated. Recommend dressing change in 3 days - new dressing provided to patient Discharge home  Doran Heater. Veverly Fells, MD 06/24/2019 8:58 AM

## 2019-06-24 NOTE — Plan of Care (Signed)
Discharge teaching is done. All questions were answered.

## 2019-06-24 NOTE — Discharge Summary (Signed)
Orthopedic Discharge Summary        Physician Discharge Summary  Patient ID: Jerome Pham. MRN: [id number] DOB/AGE: January 24, 1941 79 y.o.  Admit date: 06/23/2019 Discharge date: 06/24/2019   Procedures:  Procedure(s) (LRB): REVERSE SHOULDER ARTHROPLASTY (Right)  Attending Physician:  Dr. Esmond Plants  Admission Diagnoses:   Right shoulder end stage OA, RC insufficiency  Discharge Diagnoses:  same   Past Medical History:  Diagnosis Date  . Aortic atherosclerosis ([place])   . Barrett's esophagus with dysplasia   . GERD (gastroesophageal reflux disease)   . GERD (gastroesophageal reflux disease)   . Hypertension   . Vitamin D deficiency     PCP: Kathyrn Lass, MD   Discharged Condition: good  Hospital Course:  Patient underwent the above stated procedure on 06/23/2019. Patient tolerated the procedure well and brought to the recovery room in good condition and subsequently to the floor. Patient had an uncomplicated hospital course and was stable for discharge.   Disposition: Discharge disposition: 01-Home or Self Care      with follow up in 2 weeks   Follow-up Information    Netta Cedars, MD. Call in 2 weeks.   Specialty: Orthopedic Surgery Why: [phone] Contact information: [address] [place] [address] [phone]           Discharge Instructions    Call MD / Call 911   Complete by: As directed    If you experience chest pain or shortness of breath, CALL 911 and be transported to the hospital emergency room.  If you develope a fever above 101 F, pus (white drainage) or increased drainage or redness at the wound, or calf pain, call your surgeon's office.   Constipation Prevention   Complete by: As directed    Drink plenty of fluids.  Prune juice may be helpful.  You may use a stool softener, such as Colace (over the counter) 100 mg twice a day.  Use MiraLax (over the counter) for constipation as needed.   Diet - low sodium  heart healthy   Complete by: As directed    Increase activity slowly as tolerated   Complete by: As directed       Allergies as of 06/24/2019   No Known Allergies     Medication List    TAKE these medications   atorvastatin 10 MG tablet Commonly known as: LIPITOR Take 10 mg by mouth daily.   carbidopa-levodopa 25-100 MG tablet Commonly known as: SINEMET IR 1 pill 3 times a day. What changed:   how much to take  how to take this  when to take this  additional instructions   hydrochlorothiazide 12.5 MG capsule Commonly known as: MICROZIDE Take 12.5 mg by mouth daily.   losartan 50 MG tablet Commonly known as: COZAAR Take 50 mg by mouth daily.   omeprazole 10 MG capsule Commonly known as: PRILOSEC Take 20 mg by mouth daily.   traMADol 50 MG tablet Commonly known as: Ultram Take 1 tablet (50 mg total) by mouth every 6 (six) hours as needed for moderate pain or severe pain.         Signed: [name] 06/24/2019, 9:00 AM  [facility] is now [facility] [address], [place], [place], [place] [address] Phone: [place]

## 2019-06-24 NOTE — Progress Notes (Signed)
Occupational Therapy Evaluation Patient Details Name: Jerome Pham. MRN: [id number] DOB: [birth year] Today's Date: 06/24/2019    History of Present Illness Jerome Pham.  has presented today for surgery, with the diagnosis of Right shoulder cuff arthropathy. Right reverse total shoulder replacement    Clinical Impression   Completed all education regarding compensatory strategies and management of R UE per protocol. Written information provided and reviewed. Pt verbalized and demonstrated understanding Pt to follow up with Dr. Veverly Fells or further therapy needs.     Follow Up Recommendations  Supervision - Intermittent;Follow surgeon's recommendation for DC plan and follow-up therapies    Equipment Recommendations  (shower chair with back )    Recommendations for Other Services       Precautions / Restrictions Precautions Precautions: Shoulder Type of Shoulder Precautions: AROM of R UE for self-care tasks only, SF 90, ABD 90, ER 30 Degrees respectively Shoulder Interventions: Shoulder sling/immobilizer;Off for dressing/bathing/exercises;For comfort;At all times Precaution Booklet Issued: Yes (comment)(handout) Precaution Comments: NWB R UE Required Braces or Orthoses: Sling Restrictions Weight Bearing Restrictions: Yes RUE Weight Bearing: Non weight bearing      Mobility Bed Mobility Overal bed mobility: Modified Independent                Transfers Overall transfer level: Modified independent                    Balance Overall balance assessment: No apparent balance deficits (not formally assessed)                                         ADL either performed or assessed with clinical judgement   ADL Overall ADL's : Needs assistance/impaired Eating/Feeding: Set up   Grooming: Set up   Upper Body Bathing: Set up   Lower Body Bathing: Minimal assistance   Upper Body Dressing : Set up   Lower Body Dressing: Minimal  assistance   Toilet Transfer: Supervision/safety   Toileting- Clothing Manipulation and Hygiene: Minimal assistance   Tub/ Shower Transfer: Supervision/safety   Functional mobility during ADLs: Supervision/safety       Vision Baseline Vision/History: (wears contact lenses)       Perception     Praxis      Pertinent Vitals/Pain Pain Assessment: 0-10 Pain Score: 4  Pain Location: lower back      Hand Dominance Right   Extremity/Trunk Assessment Upper Extremity Assessment Upper Extremity Assessment: LUE deficits/detail;RUE deficits/detail RUE: Unable to fully assess due to immobilization   Lower Extremity Assessment Lower Extremity Assessment: Overall WFL for tasks assessed       Communication Communication Communication: HOH   Cognition Arousal/Alertness: Awake/alert Behavior During Therapy: WFL for tasks assessed/performed Overall Cognitive Status: Within Functional Limits for tasks assessed                                     General Comments       Exercises Exercises: Shoulder;Hand exercises Shoulder Exercises Elbow Flexion: AAROM;10 reps;Right Elbow Extension: AAROM;10 reps;Right Wrist Flexion: AROM;10 reps;Right Wrist Extension: AROM;10 reps;Right Digit Composite Flexion: AROM;10 reps;Right Composite Extension: AROM;10 reps;Right Hand Exercises Forearm Supination: AAROM;10 reps;Right Forearm Pronation: AAROM;10 reps;Right   Shoulder Instructions Shoulder Instructions Donning/doffing shirt without moving shoulder: Minimal assistance Method for sponge bathing under operated UE: Minimal assistance Donning/doffing  sling/immobilizer: Minimal assistance Correct positioning of sling/immobilizer: Minimal assistance ROM for elbow, wrist and digits of operated UE: Supervision/safety Sling wearing schedule (on at all times/off for ADL's): Modified independent Proper positioning of operated UE when showering: Supervision/safety Positioning  of UE while sleeping: [place] expects to be discharged to:: Private residence Living Arrangements: Alone Available Help at Discharge: Family Type of Home: House Home Access: Level entry     Home Layout: One level     Bathroom Shower/Tub: Teacher, early years/pre: Standard Bathroom Accessibility: Yes   Home Equipment: None          Prior Functioning/Environment Level of Independence: Independent                 OT Problem List:        OT Treatment/Interventions:      OT Goals(Current goals can be found in the care plan section)    OT Frequency:     Barriers to D/C:            Co-evaluation              AM-PAC OT "6 Clicks" Daily Activity     Outcome Measure Help from another person eating meals?: A Little Help from another person taking care of personal grooming?: A Little Help from another person toileting, which includes using toliet, bedpan, or urinal?: A Little Help from another person bathing (including washing, rinsing, drying)?: A Little Help from another person to put on and taking off regular upper body clothing?: A Little Help from another person to put on and taking off regular lower body clothing?: A Little 6 Click Score: 18   End of Session Nurse Communication: Mobility status  Activity Tolerance: Patient tolerated treatment well Patient left: in chair;with call bell/phone within reach                   Time: [id number] OT Time Calculation (min): 38 min Charges:  OT General Charges $OT Visit: 1 Visit OT Evaluation $OT Eval Low Complexity: 1 Low OT Treatments $Self Care/Home Management : 8-22 mins $Therapeutic Exercise: 8-22 mins  Kaniya Trueheart OTR/L   Jerome Pham 06/24/2019, 8:48 AM

## 2019-06-26 ENCOUNTER — Encounter: Payer: Self-pay | Admitting: *Deleted

## 2019-07-11 DIAGNOSIS — Z96611 Presence of right artificial shoulder joint: Secondary | ICD-10-CM | POA: Diagnosis not present

## 2019-07-11 DIAGNOSIS — Z471 Aftercare following joint replacement surgery: Secondary | ICD-10-CM | POA: Diagnosis not present

## 2019-08-10 DIAGNOSIS — Z0100 Encounter for examination of eyes and vision without abnormal findings: Secondary | ICD-10-CM | POA: Diagnosis not present

## 2019-08-10 DIAGNOSIS — Z135 Encounter for screening for eye and ear disorders: Secondary | ICD-10-CM | POA: Diagnosis not present

## 2019-08-10 DIAGNOSIS — I10 Essential (primary) hypertension: Secondary | ICD-10-CM | POA: Diagnosis not present

## 2019-08-10 DIAGNOSIS — H2513 Age-related nuclear cataract, bilateral: Secondary | ICD-10-CM | POA: Diagnosis not present

## 2019-08-10 DIAGNOSIS — H5210 Myopia, unspecified eye: Secondary | ICD-10-CM | POA: Diagnosis not present

## 2019-08-24 DIAGNOSIS — Z471 Aftercare following joint replacement surgery: Secondary | ICD-10-CM | POA: Diagnosis not present

## 2019-08-24 DIAGNOSIS — Z96611 Presence of right artificial shoulder joint: Secondary | ICD-10-CM | POA: Diagnosis not present

## 2019-09-07 ENCOUNTER — Ambulatory Visit: Payer: Medicare HMO | Admitting: Neurology

## 2019-09-07 DIAGNOSIS — Z125 Encounter for screening for malignant neoplasm of prostate: Secondary | ICD-10-CM | POA: Diagnosis not present

## 2019-09-07 DIAGNOSIS — Z0000 Encounter for general adult medical examination without abnormal findings: Secondary | ICD-10-CM | POA: Diagnosis not present

## 2019-09-07 DIAGNOSIS — I700 Atherosclerosis of aorta: Secondary | ICD-10-CM | POA: Diagnosis not present

## 2019-09-07 DIAGNOSIS — I10 Essential (primary) hypertension: Secondary | ICD-10-CM | POA: Diagnosis not present

## 2019-09-07 DIAGNOSIS — E538 Deficiency of other specified B group vitamins: Secondary | ICD-10-CM | POA: Diagnosis not present

## 2019-09-07 DIAGNOSIS — E559 Vitamin D deficiency, unspecified: Secondary | ICD-10-CM | POA: Diagnosis not present

## 2019-09-08 DIAGNOSIS — E559 Vitamin D deficiency, unspecified: Secondary | ICD-10-CM | POA: Diagnosis not present

## 2019-09-08 DIAGNOSIS — I10 Essential (primary) hypertension: Secondary | ICD-10-CM | POA: Diagnosis not present

## 2019-09-08 DIAGNOSIS — Z86010 Personal history of colonic polyps: Secondary | ICD-10-CM | POA: Diagnosis not present

## 2019-09-08 DIAGNOSIS — Z6827 Body mass index (BMI) 27.0-27.9, adult: Secondary | ICD-10-CM | POA: Diagnosis not present

## 2019-09-08 DIAGNOSIS — I700 Atherosclerosis of aorta: Secondary | ICD-10-CM | POA: Diagnosis not present

## 2019-09-08 DIAGNOSIS — E663 Overweight: Secondary | ICD-10-CM | POA: Diagnosis not present

## 2019-09-08 DIAGNOSIS — K2100 Gastro-esophageal reflux disease with esophagitis, without bleeding: Secondary | ICD-10-CM | POA: Diagnosis not present

## 2019-09-08 DIAGNOSIS — G20 Parkinson's disease: Secondary | ICD-10-CM | POA: Diagnosis not present

## 2019-09-18 ENCOUNTER — Ambulatory Visit: Payer: Medicare HMO | Admitting: Neurology

## 2019-09-18 ENCOUNTER — Encounter: Payer: Self-pay | Admitting: Neurology

## 2019-09-18 ENCOUNTER — Other Ambulatory Visit: Payer: Self-pay

## 2019-09-18 VITALS — BP 140/78 | HR 86 | Ht 70.5 in | Wt 182.0 lb

## 2019-09-18 DIAGNOSIS — G20 Parkinson's disease: Secondary | ICD-10-CM | POA: Diagnosis not present

## 2019-09-18 NOTE — Progress Notes (Signed)
Subjective:    Patient ID: Jerome Pham. is a 79 y.o. male.  HPI     Interim history:   Mr. 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  The patient is unaccompanied today.  I last saw him on 05/04/2019, at which time he reported that Sinemet was helpful.  Unfortunately, he had a fall in December and injured his right shoulder, he needed surgery.  Today, 09/18/2019: He reports doing well after his right shoulder replacement, he had surgery on 06/23/2019 and was discharged the day after.  He has finished physical therapy and no longer takes tramadol.  He has been in the gym on a regular basis, goes 3 to 5 days out of the week and does 20 minutes on the stationary bike and also does back strengthening and abdominal strengthening exercises on machines.  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  He does take a break when he has a longer route.  He has noticed that his right hand tremor flares up when he activates his left hand but not vice versa.  He has not had any new symptoms, sleeps fairly well with the exception of occasional right shoulder pain waking him up and he denies any significant constipation.  He tolerates the Sinemet and continues to take it 3 times a day, making sure that he is bases out the dose depending on his mealtimes.   The patient's allergies, current medications, family history, past medical history, past social history, past surgical history and problem list were reviewed and updated as appropriate.    Previously:   I saw him on 12/29/2018, at which time he reported that his tremor was worse.  He was encouraged to start Sinemet.              He declined a virtual visit in March 2020 and a follow-up from June 2020  was delayed as well. I first met him on 12/23/2017 at the request of his primary care physician, Dr. Kathyrn Lass, at which time the patient reported a several month history, maybe a one-year history of primarily right hand tremor. On examination, he had mild signs of parkinsonism. I suggested we proceed with testing in the form DaT scan. He had had a brain MRI about 6 months prior. He requested to hold off on the nuclear medicine scan and wanted to discuss this with his PCP first.    12/23/2017: (He) is noted to have a hand tremor for the past several months, maybe a year. I reviewed your office records from 10/18/2017, as well as 08/13/2017. He has noticed a tremor in the right hand for the past several months, does not typically bother him, variable in intensity. He has had no recent falls. His most recent episode of vertigo lasted about 2 days and happened about 2 months ago. Prior to that he had an episode of sudden onset of vertigo which also lasted about 48 hours. He was given exercises to do at home which helps. Meclizine did not help very much. He had a brain MRI with and without contrast on 06/03/2017 and I reviewed the results: IMPRESSION: No acute abnormality. No significant ischemia. 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   He is single and lives alone, he is retired. He quit smoking in 1969, drinks alcohol in the form of beer, 2-3 per day on average and one glass of wine. He does not drink caffeine on a regular basis, maybe one cup of unsweet tea. No FHx of ET or PD. Tries to exercise at least 3 days/week, up to 5 days.  Divorced since 2004 from his second wife, has one son, age 77, in [place].  Has 2 older kids from 53st (ex-wife in [place], and daughter and son here as well, ages 72 and 55). He sleeps fairly well. He has nocturia about 2 times  per average night, denies any vivid dreams or dream enactments. He has no issues with constipation, tries to eat healthy.   His Past Medical History Is Significant For: Past Medical History:  Diagnosis Date  . Aortic atherosclerosis ([place])   . Barrett's esophagus with dysplasia   . GERD (gastroesophageal reflux disease)   . GERD (gastroesophageal reflux disease)   . Hypertension   . Vitamin D deficiency     His Past Surgical History Is Significant For: Past Surgical History:  Procedure Laterality Date  . CARPAL TUNNEL RELEASE    . KNEE SURGERY Right   . REVERSE SHOULDER ARTHROPLASTY Right 06/23/2019   Procedure: REVERSE SHOULDER ARTHROPLASTY;  Surgeon: Netta Cedars, MD;  Location: [facility];  Service: Orthopedics;  Laterality: Right;  interscalene block  . SHOULDER SURGERY Left   . TONSILLECTOMY      His Family History Is Significant For: Family History  Problem Relation Age of Onset  . COPD Mother   . Heart attack Father        his father died of heart attack at age of 2. All six of his father's brothers died of heart attack.    His Social History Is Significant For: Social History   Socioeconomic History  . Marital status: Single    Spouse name: Not on file  . Number of children: Not on file  . Years of education: Not on file  . Highest education level: Not on file  Occupational History  . Not on file  Tobacco Use  . Smoking status: Former Research scientist (life sciences)  . Smokeless tobacco: Never Used  Vaping Use  . Vaping Use: Never used  Substance and Sexual Activity  . Alcohol use: Yes    Alcohol/week: 3.0 standard drinks    Types: 2 Cans of beer, 1 Glasses of wine per week    Comment: nightly  . Drug use: No  . Sexual activity: Not on file  Other Topics Concern  . Not on file  Social History Narrative  . Not on file   Social Determinants of Health   Financial Resource Strain:   . Difficulty of Paying Living Expenses:   Food Insecurity:   . Worried About Sales executive in the Last Year:   . Arboriculturist in the Last Year:   Transportation Needs:   . Film/video editor (Medical):   Marland Kitchen Lack of Transportation (Non-Medical):   Physical Activity:   . Days of Exercise per Week:   . Minutes of Exercise per Session:   Stress:   . Feeling of Stress :   Social Connections:   . Frequency of Communication with Friends and Family:   . Frequency of Social Gatherings with Friends and Family:   . Attends Religious Services:   . Active Member of Clubs or  Organizations:   . Attends Archivist Meetings:   Marland Kitchen Marital Status:     His Allergies Are:  No Known Allergies:   His Current Medications Are:  Outpatient Encounter Medications as of 09/18/2019  Medication Sig  . atorvastatin (LIPITOR) 10 MG tablet Take 10 mg by mouth daily.  . carbidopa-levodopa (SINEMET IR) 25-100 MG tablet 1 pill 3 times a day. (Patient taking differently: Take 1 tablet by mouth 3 (three) times daily. )  . hydrochlorothiazide (MICROZIDE) 12.5 MG capsule Take 12.5 mg by mouth daily.  Marland Kitchen losartan (COZAAR) 50 MG tablet Take 50 mg by mouth daily.  Marland Kitchen omeprazole (PRILOSEC) 10 MG capsule Take 20 mg by mouth daily.   . [DISCONTINUED] traMADol (ULTRAM) 50 MG tablet Take 1 tablet (50 mg total) by mouth every 6 (six) hours as needed for moderate pain or severe pain.   No facility-administered encounter medications on file as of 09/18/2019.  :  Review of Systems:  Out of a complete 14 point review of systems, all are reviewed and negative with the exception of these symptoms as listed below: Review of Systems  Neurological:       Here for 4 month f/u. Reports tremors are the same since last visit.    Objective:  Neurological Exam  Physical Exam Physical Examination:   Vitals:   09/18/19 0913  BP: 140/78  Pulse: 86    General Examination: The patient is a very pleasant 79 y.o. male in no acute distress. He appears well-developed and well-nourished and well groomed.    HEENT:Normocephalic, atraumatic, pupils are equal, round and reactive to light. Hearing isimpaired despitebilateral hearing aids in place, but stable Face showsmildfacial masking. He has no lip, neck or jaw tremor. Mild nuchal rigidity is noted. He has on airway examination mild mouth dryness, otherwise nonfocal findings, tongue protrudes centrally and palate elevates symmetrically. Speech is clear, no dysarthria, no sialorrhea.  No carotid bruits.  Chest:Clear to auscultation without wheezing, rhonchi or crackles noted.  Heart:S1+S2+0, regular and normal without murmurs, rubs or gallops noted.   Abdomen:Soft, non-tender and non-distended with normal bowel sounds appreciated on auscultation.  Extremities:There isnopitting edema in the distal lower extremities bilaterally.   Skin: Warm and dry without trophic changes noted.Chronic age-related changes noted.  Musculoskeletal: exam reveals Limited range of motion in the right shoulder, not able to lift arm above shoulder height.    Neurologically:  Mental status: The patient is awake, alert and oriented in all 4 spheres.Hisimmediate and remote memory, attention, language skills and fund of knowledge are appropriate. There is no evidence of aphasia, agnosia, apraxia or anomia. Speech is clear with normal prosody and enunciation. Thought process is linear. Mood is normaland affect is normal.  Cranial nerves II - XII are as described above under HEENT exam.Left shoulder height is a little higher than right.  (On9/26/2019: on Archimedes spiral drawing he has no significant difficulty with the right hand, he has coarse difficulty with the left/nondominant hand, handwriting with his right hand is slightly on the smaller side, legible, not particularly tremulous.)  Motor exam: Normal bulk,normal strength for age, mild increase in tone in the right upper extremity with some cogwheeling noted. He has a moderate resting tremor  in the right upper extremity, a slight and intermittent resting tremor in the left upper extremity, better today. He has a minimal postural tremor in both upper extremities, no significant action tremor, no intention tremor. Fine motor skills show mild difficulty with finger taps and foot  taps on the right, otherwise fairly normal findings on the left. He stands without difficulty, posture is mildly stooped, more so than last time, right shoulder little lower than left. He walks with fairly good pace and stride length, decreased arm swing and more pronounced tremor on the right upper extremity. He turns well, but has 1 small instance of catching the left toes against the right while turning, he may turn a little bit too fast at times.   Cerebellar testing: No dysmetria or intention tremor on finger to nose testing. There is no truncal or gait ataxia.  Sensory exam: intact to light touch.  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  He has responded to Sinemet and continues to take 1 pill 3 times daily.  He tolerates the medication and has had no new symptoms.  He had interim right shoulder replacement and has done well afterwards.  He is advised to continue to monitor his driving skills.  He has a fairly stable examination today.  He is very active physically and is commended for his healthy lifestyle.  He is advised to follow-up routinely to see me or one of our nurse practitioners in 6 months, sooner if needed.  I answered all his questions today and he was in agreement, he did not need a refill on his Sinemet today. I spent 20 minutes in total face-to-face time  and in reviewing records during pre-charting, more than 50% of which was spent in counseling and coordination of care, reviewing test results, reviewing medications and treatment regimen and/or in discussing or reviewing the diagnosis of PD, the prognosis and treatment options. Pertinent laboratory and imaging test results that were available during this visit with the patient were reviewed by me and considered in my medical decision making (see chart for details).

## 2019-09-18 NOTE — Patient Instructions (Signed)
I am glad to hear that you have done well after your shoulder surgery.  Please keep up the good work and continue to stay active mentally and physically.  Please monitor your driving skills, please take a break if you drive more than 2 hours.  Your exam is fairly stable.  Please make sure that you return slowly and when you first stand up after being seated for a while, please stand up slowly and get your bearings first.  We will continue with your Sinemet at the current dose.  Follow-up in 6 months, sooner if needed.  Please call us for any interim questions or concerns.

## 2019-10-15 DIAGNOSIS — R21 Rash and other nonspecific skin eruption: Secondary | ICD-10-CM | POA: Diagnosis not present

## 2019-11-05 DIAGNOSIS — R21 Rash and other nonspecific skin eruption: Secondary | ICD-10-CM | POA: Diagnosis not present

## 2019-11-23 DIAGNOSIS — M25511 Pain in right shoulder: Secondary | ICD-10-CM | POA: Diagnosis not present

## 2019-11-28 DIAGNOSIS — D485 Neoplasm of uncertain behavior of skin: Secondary | ICD-10-CM | POA: Diagnosis not present

## 2019-11-28 DIAGNOSIS — L298 Other pruritus: Secondary | ICD-10-CM | POA: Diagnosis not present

## 2019-11-28 DIAGNOSIS — L309 Dermatitis, unspecified: Secondary | ICD-10-CM | POA: Diagnosis not present

## 2019-11-28 DIAGNOSIS — L120 Bullous pemphigoid: Secondary | ICD-10-CM | POA: Diagnosis not present

## 2020-01-03 DIAGNOSIS — L120 Bullous pemphigoid: Secondary | ICD-10-CM | POA: Diagnosis not present

## 2020-02-06 DIAGNOSIS — L120 Bullous pemphigoid: Secondary | ICD-10-CM | POA: Diagnosis not present

## 2020-03-18 ENCOUNTER — Other Ambulatory Visit: Payer: Self-pay

## 2020-03-18 ENCOUNTER — Ambulatory Visit: Payer: Medicare HMO | Admitting: Adult Health

## 2020-03-18 ENCOUNTER — Encounter: Payer: Self-pay | Admitting: Adult Health

## 2020-03-18 VITALS — BP 147/76 | HR 83 | Ht 71.0 in | Wt 180.0 lb

## 2020-03-18 DIAGNOSIS — G20 Parkinson's disease: Secondary | ICD-10-CM | POA: Diagnosis not present

## 2020-03-18 MED ORDER — CARBIDOPA-LEVODOPA 25-100 MG PO TABS: ORAL_TABLET | ORAL | 3 refills | Status: DC

## 2020-03-18 NOTE — Progress Notes (Addendum)
PATIENT: Jerome Pham. DOB: 05-19-1940  REASON FOR VISIT: follow up HISTORY FROM: patient  HISTORY OF PRESENT ILLNESS: Today 03/18/20:  Jerome Pham is a 79 year old male with a history of Parkinson's disease.  He returns today for follow-up.  Overall he feels that he is doing well.  He continues on Sinemet 25-100 mg 3 times a day.  He reports that the tremor is primarily in the right hand.  He denies any changes with his gait or balance.  Reports has not had any recent falls.  Denies any trouble sleeping.  No trouble chewing or swallowing food.  He attends the gym 3-5 times a week.  Returns today for evaluation.  HISTORY 09/18/2019: He reports doing well after his right shoulder replacement, he had surgery on 06/23/2019 and was discharged the day after.  He has finished physical therapy and no longer takes tramadol.  He has been in the gym on a regular basis, goes 3 to 5 days out of the week and does 20 minutes on the stationary bike and also does back strengthening and abdominal strengthening exercises on machines.  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  He does take a break when he has a longer route.  He has noticed that his right hand tremor flares up when he activates his left hand but not vice versa.  He has not had any new symptoms, sleeps fairly well with the exception of occasional right shoulder pain waking him up and he denies any significant constipation.  He tolerates the Sinemet and continues to take it 3 times a day, making sure that he is bases out the dose depending on his mealtimes.  The patient's allergies, current medications, family history, past medical history, past social history, past surgical history and problem list were reviewed and updated as appropriate.   REVIEW OF SYSTEMS: Out of a complete 14 system review of symptoms, the patient complains only of the following symptoms, and all other reviewed systems are negative.  See  HPI  ALLERGIES: No Known Allergies  HOME MEDICATIONS: Outpatient Medications Prior to Visit  Medication Sig Dispense Refill  . atorvastatin (LIPITOR) 10 MG tablet Take 10 mg by mouth daily.    . carbidopa-levodopa (SINEMET IR) 25-100 MG tablet 1 pill 3 times a day. (Patient taking differently: Take 1 tablet by mouth 3 (three) times daily.) 270 tablet 3  . hydrochlorothiazide (MICROZIDE) 12.5 MG capsule Take 12.5 mg by mouth daily.    Marland Kitchen losartan (COZAAR) 50 MG tablet Take 50 mg by mouth daily.    Marland Kitchen omeprazole (PRILOSEC) 10 MG capsule Take 20 mg by mouth daily.      No facility-administered medications prior to visit.    PAST MEDICAL HISTORY: Past Medical History:  Diagnosis Date  . Aortic atherosclerosis ([place])   . Barrett's esophagus with dysplasia   . GERD (gastroesophageal reflux disease)   . GERD (gastroesophageal reflux disease)   . Hypertension   . Vitamin D deficiency     PAST SURGICAL HISTORY: Past Surgical History:  Procedure Laterality Date  . CARPAL TUNNEL RELEASE    . KNEE SURGERY Right   . REVERSE SHOULDER ARTHROPLASTY Right 06/23/2019   Procedure: REVERSE SHOULDER ARTHROPLASTY;  Surgeon: Netta Cedars, MD;  Location: [facility];  Service: Orthopedics;  Laterality: Right;  interscalene block  . SHOULDER SURGERY Left   . TONSILLECTOMY      FAMILY HISTORY: Family History  Problem Relation Age of Onset  .  COPD Mother   . Heart attack Father        his father died of heart attack at age of 75. All six of his father's brothers died of heart attack.    SOCIAL HISTORY: Social History   Socioeconomic History  . Marital status: Single    Spouse name: Not on file  . Number of children: Not on file  . Years of education: Not on file  . Highest education level: Not on file  Occupational History  . Not on file  Tobacco Use  . Smoking status: Former Research scientist (life sciences)  . Smokeless tobacco: Never Used  Vaping Use  . Vaping Use: Never used  Substance and Sexual Activity  .  Alcohol use: Yes    Alcohol/week: 3.0 standard drinks    Types: 2 Cans of beer, 1 Glasses of wine per week    Comment: nightly  . Drug use: No  . Sexual activity: Not on file  Other Topics Concern  . Not on file  Social History Narrative  . Not on file   Social Determinants of Health   Financial Resource Strain: Not on file  Food Insecurity: Not on file  Transportation Needs: Not on file  Physical Activity: Not on file  Stress: Not on file  Social Connections: Not on file  Intimate Partner Violence: Not on file      PHYSICAL EXAM  Vitals:   03/18/20 1002  BP: (!) 147/76  Pulse: 83  Weight: 180 lb (81.6 kg)  Height: 5\' 11"  (1.803 m)   Body mass index is 25.1 kg/m.  Generalized: Well developed, in no acute distress   Neurological examination  Mentation: Alert oriented to time, place, history taking. Follows all commands speech and language fluent Cranial nerve II-XII: Pupils were equal round reactive to light. Extraocular movements were full, visual field were full on confrontational test. Facial sensation and strength were normal. Uvula tongue midline. Head turning and shoulder shrug  were normal and symmetric. Motor: The motor testing reveals 5 over 5 strength of all 4 extremities. Good symmetric motor tone is noted throughout.  Mild impairment of finger taps and toe taps bilaterally.  Sensory: Sensory testing is intact to soft touch on all 4 extremities. No evidence of extinction is noted.  Coordination: Cerebellar testing reveals good finger-nose-finger and heel-to-shin bilaterally.  Gait and station: Gait is normal-good stride.  Tremor in the right hand when ambulating 4-5 steps with turns Reflexes: Deep tendon reflexes are symmetric and normal bilaterally.   DIAGNOSTIC DATA (LABS, IMAGING, TESTING) - I reviewed patient records, labs, notes, testing and imaging myself where available.  Lab Results  Component Value Date   WBC 6.2 06/16/2019   HGB 12.7 (L)  06/24/2019   HCT 37.8 (L) 06/24/2019   MCV 98.4 06/16/2019   PLT 301 06/16/2019      Component Value Date/Time   NA 131 (L) 06/24/2019 0340   K 3.2 (L) 06/24/2019 0340   CL 94 (L) 06/24/2019 0340   CO2 27 06/24/2019 0340   GLUCOSE 134 (H) 06/24/2019 0340   BUN 12 06/24/2019 0340   CREATININE 0.76 06/24/2019 0340   CALCIUM 8.2 (L) 06/24/2019 0340   PROT 6.3 (L) 12/03/2016 1335   ALBUMIN 3.8 12/03/2016 1335   AST 56 (H) 12/03/2016 1335   ALT 29 12/03/2016 1335   ALKPHOS 40 12/03/2016 1335   BILITOT 1.6 (H) 12/03/2016 1335   GFRNONAA >60 06/24/2019 0340   GFRAA >60 06/24/2019 0340   Lab Results  Component Value Date   CHOL 154 12/04/2016   HDL 73 12/04/2016   LDLCALC 72 12/04/2016   TRIG 44 12/04/2016   CHOLHDL 2.1 12/04/2016   Lab Results  Component Value Date   HGBA1C 5.2 12/04/2016        ASSESSMENT AND PLAN 80 y.o. year old male  has a past medical history of Aortic atherosclerosis ([place]), Barrett's esophagus with dysplasia, GERD (gastroesophageal reflux disease), GERD (gastroesophageal reflux disease), Hypertension, and Vitamin D deficiency. here with:  1.  Parkinson's disease  --Continue Sinemet 25-100 mg 3 times a day --Advised to stay well-hydrated --Try to remain physically active --Follow-up in 6 months or sooner if needed   I spent 25 minutes of face-to-face and non-face-to-face time with patient.  This included previsit chart review, lab review, study review, order entry, electronic health record documentation, patient education.  Ward Givens, MSN, NP-C 03/18/2020, 10:19 AM [facility] [address] [place], [place] [address] [phone]

## 2020-03-18 NOTE — Patient Instructions (Signed)
Your Plan:  Continue sinemet three times a day Stay well hydrated If your symptoms worsen or you develop new symptoms please let us know.    Thank you for coming to see [place] at [facility]. I hope we have been able to provide you high quality care today.  You may receive a patient satisfaction survey over the next few weeks. We would appreciate your feedback and comments so that we may continue to improve ourselves and the health of our patients.

## 2020-03-19 DIAGNOSIS — L120 Bullous pemphigoid: Secondary | ICD-10-CM | POA: Diagnosis not present

## 2020-03-28 DIAGNOSIS — Z20822 Contact with and (suspected) exposure to covid-19: Secondary | ICD-10-CM | POA: Diagnosis not present

## 2020-04-22 DIAGNOSIS — M7918 Myalgia, other site: Secondary | ICD-10-CM | POA: Diagnosis not present

## 2020-04-23 DIAGNOSIS — Z1152 Encounter for screening for COVID-19: Secondary | ICD-10-CM | POA: Diagnosis not present

## 2020-04-29 DIAGNOSIS — M25552 Pain in left hip: Secondary | ICD-10-CM | POA: Diagnosis not present

## 2020-04-30 DIAGNOSIS — L120 Bullous pemphigoid: Secondary | ICD-10-CM | POA: Diagnosis not present

## 2020-05-14 DIAGNOSIS — M25552 Pain in left hip: Secondary | ICD-10-CM | POA: Diagnosis not present

## 2020-05-27 DIAGNOSIS — Z96611 Presence of right artificial shoulder joint: Secondary | ICD-10-CM | POA: Diagnosis not present

## 2020-05-29 DIAGNOSIS — M25511 Pain in right shoulder: Secondary | ICD-10-CM | POA: Diagnosis not present

## 2020-06-10 DIAGNOSIS — S42201B Unspecified fracture of upper end of right humerus, initial encounter for open fracture: Secondary | ICD-10-CM | POA: Diagnosis not present

## 2020-06-10 DIAGNOSIS — M25511 Pain in right shoulder: Secondary | ICD-10-CM | POA: Diagnosis not present

## 2020-06-12 DIAGNOSIS — M25511 Pain in right shoulder: Secondary | ICD-10-CM | POA: Diagnosis not present

## 2020-06-28 DIAGNOSIS — M25511 Pain in right shoulder: Secondary | ICD-10-CM | POA: Diagnosis not present

## 2020-07-04 DIAGNOSIS — S66811D Strain of other specified muscles, fascia and tendons at wrist and hand level, right hand, subsequent encounter: Secondary | ICD-10-CM | POA: Diagnosis not present

## 2020-07-04 DIAGNOSIS — M65331 Trigger finger, right middle finger: Secondary | ICD-10-CM | POA: Diagnosis not present

## 2020-07-16 DIAGNOSIS — S42201A Unspecified fracture of upper end of right humerus, initial encounter for closed fracture: Secondary | ICD-10-CM | POA: Diagnosis not present

## 2020-07-16 DIAGNOSIS — Z96611 Presence of right artificial shoulder joint: Secondary | ICD-10-CM | POA: Diagnosis not present

## 2020-07-29 DIAGNOSIS — M25511 Pain in right shoulder: Secondary | ICD-10-CM | POA: Diagnosis not present

## 2020-08-13 DIAGNOSIS — S42201A Unspecified fracture of upper end of right humerus, initial encounter for closed fracture: Secondary | ICD-10-CM | POA: Diagnosis not present

## 2020-08-13 DIAGNOSIS — Z96611 Presence of right artificial shoulder joint: Secondary | ICD-10-CM | POA: Diagnosis not present

## 2020-08-20 DIAGNOSIS — R69 Illness, unspecified: Secondary | ICD-10-CM | POA: Diagnosis not present

## 2020-08-27 DIAGNOSIS — S66312D Strain of extensor muscle, fascia and tendon of right middle finger at wrist and hand level, subsequent encounter: Secondary | ICD-10-CM | POA: Diagnosis not present

## 2020-08-28 DIAGNOSIS — Z0100 Encounter for examination of eyes and vision without abnormal findings: Secondary | ICD-10-CM | POA: Diagnosis not present

## 2020-08-28 DIAGNOSIS — H5210 Myopia, unspecified eye: Secondary | ICD-10-CM | POA: Diagnosis not present

## 2020-08-28 DIAGNOSIS — I10 Essential (primary) hypertension: Secondary | ICD-10-CM | POA: Diagnosis not present

## 2020-09-10 DIAGNOSIS — L120 Bullous pemphigoid: Secondary | ICD-10-CM | POA: Diagnosis not present

## 2020-09-13 DIAGNOSIS — E663 Overweight: Secondary | ICD-10-CM | POA: Diagnosis not present

## 2020-09-13 DIAGNOSIS — Z86010 Personal history of colonic polyps: Secondary | ICD-10-CM | POA: Diagnosis not present

## 2020-09-13 DIAGNOSIS — E559 Vitamin D deficiency, unspecified: Secondary | ICD-10-CM | POA: Diagnosis not present

## 2020-09-13 DIAGNOSIS — K2100 Gastro-esophageal reflux disease with esophagitis, without bleeding: Secondary | ICD-10-CM | POA: Diagnosis not present

## 2020-09-13 DIAGNOSIS — Z79899 Other long term (current) drug therapy: Secondary | ICD-10-CM | POA: Diagnosis not present

## 2020-09-13 DIAGNOSIS — I700 Atherosclerosis of aorta: Secondary | ICD-10-CM | POA: Diagnosis not present

## 2020-09-13 DIAGNOSIS — G20 Parkinson's disease: Secondary | ICD-10-CM | POA: Diagnosis not present

## 2020-09-13 DIAGNOSIS — K22719 Barrett's esophagus with dysplasia, unspecified: Secondary | ICD-10-CM | POA: Diagnosis not present

## 2020-09-13 DIAGNOSIS — I10 Essential (primary) hypertension: Secondary | ICD-10-CM | POA: Diagnosis not present

## 2020-09-13 DIAGNOSIS — Z6827 Body mass index (BMI) 27.0-27.9, adult: Secondary | ICD-10-CM | POA: Diagnosis not present

## 2020-09-19 ENCOUNTER — Ambulatory Visit: Payer: Medicare HMO | Admitting: Adult Health

## 2020-09-19 ENCOUNTER — Other Ambulatory Visit: Payer: Self-pay

## 2020-09-19 VITALS — BP 149/80 | HR 89 | Ht 71.0 in | Wt 186.0 lb

## 2020-09-19 DIAGNOSIS — G20 Parkinson's disease: Secondary | ICD-10-CM | POA: Diagnosis not present

## 2020-09-19 NOTE — Patient Instructions (Signed)
Your Plan:  Continue Sinemet three times a day  Stay well-hydrated If your symptoms worsen or you develop new symptoms please let us know.   Thank you for coming to see [place] at [facility]. I hope we have been able to provide you high quality care today.  You may receive a patient satisfaction survey over the next few weeks. We would appreciate your feedback and comments so that we may continue to improve ourselves and the health of our patients.

## 2020-09-19 NOTE — Progress Notes (Signed)
PATIENT: Jerome Pham. DOB: 1940/07/11  REASON FOR VISIT: follow up HISTORY FROM: patient Primary neurologist: Dr. Rexene Alberts  HISTORY OF PRESENT ILLNESS: Today 09/19/20:  Jerome Pham is an 80 year old male with a history of Parkinson's disease.  He returns today for follow-up.  He states that he had a fall 3 months ago.  Reports that he was walking in a parking lot and tripped over the curb.  He states that he did fracture his right shoulder but fortunately did not need surgery.  He states that he has gotten 85% of his mobility back in that shoulder.  He feels that his tremor has remained stable.  Right hand is worse than left.  He denies any additional falls.  Denies any trouble chewing or swallowing food.  Reports that he sleeps well.  He continues on Sinemet 25-100 mg 3 times a day  03/18/20:[name] is a 80 year old male with a history of Parkinson's disease.  He returns today for follow-up.  Overall he feels that he is doing well.  He continues on Sinemet 25-100 mg 3 times a day.  He reports that the tremor is primarily in the right hand.  He denies any changes with his gait or balance.  Reports has not had any recent falls.  Denies any trouble sleeping.  No trouble chewing or swallowing food.  He attends the gym 3-5 times a week.  Returns today for evaluation.  HISTORY 09/18/2019: He reports doing well after his right shoulder replacement, he had surgery on 06/23/2019 and was discharged the day after.  He has finished physical therapy and no longer takes tramadol.  He has been in the gym on a regular basis, goes 3 to 5 days out of the week and does 20 minutes on the stationary bike and also does back strengthening and abdominal strengthening exercises on machines.  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  He does take a break when he has a longer route.  He has noticed that his right hand tremor flares up when he activates his left hand but not vice versa.  He  has not had any new symptoms, sleeps fairly well with the exception of occasional right shoulder pain waking him up and he denies any significant constipation.  He tolerates the Sinemet and continues to take it 3 times a day, making sure that he is bases out the dose depending on his mealtimes.   The patient's allergies, current medications, family history, past medical history, past social history, past surgical history and problem list were reviewed and updated as appropriate.     REVIEW OF SYSTEMS: Out of a complete 14 system review of symptoms, the patient complains only of the following symptoms, and all other reviewed systems are negative.  See HPI  ALLERGIES: No Known Allergies  HOME MEDICATIONS: Outpatient Medications Prior to Visit  Medication Sig Dispense Refill   atorvastatin (LIPITOR) 10 MG tablet Take 10 mg by mouth daily.     carbidopa-levodopa (SINEMET IR) 25-100 MG tablet 1 pill 3 times a day. 270 tablet 3   hydrochlorothiazide (MICROZIDE) 12.5 MG capsule Take 12.5 mg by mouth daily.     losartan (COZAAR) 50 MG tablet Take 50 mg by mouth daily.     omeprazole (PRILOSEC) 10 MG capsule Take 20 mg by mouth daily.      No facility-administered medications prior to visit.    PAST MEDICAL HISTORY: Past Medical History:  Diagnosis Date  Aortic atherosclerosis (HCC)    Barrett's esophagus with dysplasia    GERD (gastroesophageal reflux disease)    GERD (gastroesophageal reflux disease)    Hypertension    Vitamin D deficiency     PAST SURGICAL HISTORY: Past Surgical History:  Procedure Laterality Date   CARPAL TUNNEL RELEASE     KNEE SURGERY Right    REVERSE SHOULDER ARTHROPLASTY Right 06/23/2019   Procedure: REVERSE SHOULDER ARTHROPLASTY;  Surgeon: Netta Cedars, MD;  Location: [facility];  Service: Orthopedics;  Laterality: Right;  interscalene block   SHOULDER SURGERY Left    TONSILLECTOMY      FAMILY HISTORY: Family History  Problem Relation Age of Onset   COPD  Mother    Heart attack Father        his father died of heart attack at age of 40. All six of his father's brothers died of heart attack.    SOCIAL HISTORY: Social History   Socioeconomic History   Marital status: Single    Spouse name: Not on file   Number of children: Not on file   Years of education: Not on file   Highest education level: Not on file  Occupational History   Not on file  Tobacco Use   Smoking status: Former    Pack years: 0.00   Smokeless tobacco: Never  Vaping Use   Vaping Use: Never used  Substance and Sexual Activity   Alcohol use: Yes    Alcohol/week: 3.0 standard drinks    Types: 2 Cans of beer, 1 Glasses of wine per week    Comment: nightly   Drug use: No   Sexual activity: Not on file  Other Topics Concern   Not on file  Social History Narrative   Not on file   Social Determinants of Health   Financial Resource Strain: Not on file  Food Insecurity: Not on file  Transportation Needs: Not on file  Physical Activity: Not on file  Stress: Not on file  Social Connections: Not on file  Intimate Partner Violence: Not on file      PHYSICAL EXAM  Vitals:   09/19/20 0753  BP: (!) 149/80  Pulse: 89  Weight: 186 lb (84.4 kg)  Height: 5\' 11"  (1.803 m)   Body mass index is 25.94 kg/m.  Generalized: Well developed, in no acute distress   Neurological examination  Mentation: Alert oriented to time, place, history taking. Follows all commands speech and language fluent Cranial nerve II-XII: Pupils were equal round reactive to light. Extraocular movements were full, visual field were full on confrontational test. Facial sensation and strength were normal. Uvula tongue midline. Head turning and shoulder shrug  were normal and symmetric. Motor: The motor testing reveals 5 over 5 strength of all 4 extremities. Good symmetric motor tone is noted throughout.  Mild impairment of finger taps and toe taps bilaterally.  Sensory: Sensory testing is  intact to soft touch on all 4 extremities. No evidence of extinction is noted.  Coordination: Cerebellar testing reveals good finger-nose-finger and heel-to-shin bilaterally.  Gait and station: Gait is normal-good stride.  Tremor in the right hand when ambulating 3-4 steps with turns Reflexes: Deep tendon reflexes are symmetric and normal bilaterally.   DIAGNOSTIC DATA (LABS, IMAGING, TESTING) - I reviewed patient records, labs, notes, testing and imaging myself where available.  Lab Results  Component Value Date   WBC 6.2 06/16/2019   HGB 12.7 (L) 06/24/2019   HCT 37.8 (L) 06/24/2019   MCV 98.4 06/16/2019  PLT 301 06/16/2019      Component Value Date/Time   NA 131 (L) 06/24/2019 0340   K 3.2 (L) 06/24/2019 0340   CL 94 (L) 06/24/2019 0340   CO2 27 06/24/2019 0340   GLUCOSE 134 (H) 06/24/2019 0340   BUN 12 06/24/2019 0340   CREATININE 0.76 06/24/2019 0340   CALCIUM 8.2 (L) 06/24/2019 0340   PROT 6.3 (L) 12/03/2016 1335   ALBUMIN 3.8 12/03/2016 1335   AST 56 (H) 12/03/2016 1335   ALT 29 12/03/2016 1335   ALKPHOS 40 12/03/2016 1335   BILITOT 1.6 (H) 12/03/2016 1335   GFRNONAA >60 06/24/2019 0340   GFRAA >60 06/24/2019 0340   Lab Results  Component Value Date   CHOL 154 12/04/2016   HDL 73 12/04/2016   LDLCALC 72 12/04/2016   TRIG 44 12/04/2016   CHOLHDL 2.1 12/04/2016   Lab Results  Component Value Date   HGBA1C 5.2 12/04/2016        ASSESSMENT AND PLAN 80 y.o. year old male  has a past medical history of Aortic atherosclerosis ([place]), Barrett's esophagus with dysplasia, GERD (gastroesophageal reflux disease), GERD (gastroesophageal reflux disease), Hypertension, and Vitamin D deficiency. here with:  1.  Parkinson's disease  --Continue Sinemet 25-100 mg 3 times a day --Advised to stay well-hydrated -- Advised if symptoms worsen or he develops new symptoms he should let us know --Follow-up in 6 months or sooner if needed   Ward Givens, MSN, NP-C  09/19/2020, 8:11 AM [facility] [address] [place], [place] [address] [phone]

## 2020-10-09 DIAGNOSIS — S66392A Other injury of extensor muscle, fascia and tendon of right middle finger at wrist and hand level, initial encounter: Secondary | ICD-10-CM | POA: Diagnosis not present

## 2020-10-09 DIAGNOSIS — S66312D Strain of extensor muscle, fascia and tendon of right middle finger at wrist and hand level, subsequent encounter: Secondary | ICD-10-CM | POA: Diagnosis not present

## 2020-10-10 ENCOUNTER — Telehealth: Payer: Self-pay | Admitting: *Deleted

## 2020-10-10 NOTE — Telephone Encounter (Signed)
DMV form completed for PD pt.  To MM/NP for review, completion and signature.

## 2020-10-15 NOTE — Telephone Encounter (Signed)
signed

## 2020-10-16 NOTE — Telephone Encounter (Signed)
Back at you to complete.

## 2020-10-17 DIAGNOSIS — K2100 Gastro-esophageal reflux disease with esophagitis, without bleeding: Secondary | ICD-10-CM | POA: Diagnosis not present

## 2020-10-17 DIAGNOSIS — K22719 Barrett's esophagus with dysplasia, unspecified: Secondary | ICD-10-CM | POA: Diagnosis not present

## 2020-10-17 DIAGNOSIS — Z86010 Personal history of colonic polyps: Secondary | ICD-10-CM | POA: Diagnosis not present

## 2020-10-17 NOTE — Telephone Encounter (Signed)
DMV to medical records.

## 2020-10-21 DIAGNOSIS — M25641 Stiffness of right hand, not elsewhere classified: Secondary | ICD-10-CM | POA: Diagnosis not present

## 2020-10-23 ENCOUNTER — Telehealth: Payer: Self-pay | Admitting: *Deleted

## 2020-10-23 DIAGNOSIS — L120 Bullous pemphigoid: Secondary | ICD-10-CM | POA: Diagnosis not present

## 2020-10-23 NOTE — Telephone Encounter (Signed)
Pt medical report form @ front desk for p/u

## 2020-10-24 DIAGNOSIS — H903 Sensorineural hearing loss, bilateral: Secondary | ICD-10-CM | POA: Diagnosis not present

## 2020-10-24 DIAGNOSIS — Z570 Occupational exposure to noise: Secondary | ICD-10-CM | POA: Diagnosis not present

## 2020-12-10 DIAGNOSIS — K5730 Diverticulosis of large intestine without perforation or abscess without bleeding: Secondary | ICD-10-CM | POA: Diagnosis not present

## 2020-12-10 DIAGNOSIS — K2281 Esophageal polyp: Secondary | ICD-10-CM | POA: Diagnosis not present

## 2020-12-10 DIAGNOSIS — K449 Diaphragmatic hernia without obstruction or gangrene: Secondary | ICD-10-CM | POA: Diagnosis not present

## 2020-12-10 DIAGNOSIS — Z86010 Personal history of colonic polyps: Secondary | ICD-10-CM | POA: Diagnosis not present

## 2020-12-10 DIAGNOSIS — K317 Polyp of stomach and duodenum: Secondary | ICD-10-CM | POA: Diagnosis not present

## 2020-12-10 DIAGNOSIS — K5710 Diverticulosis of small intestine without perforation or abscess without bleeding: Secondary | ICD-10-CM | POA: Diagnosis not present

## 2020-12-10 DIAGNOSIS — K2270 Barrett's esophagus without dysplasia: Secondary | ICD-10-CM | POA: Diagnosis not present

## 2020-12-10 DIAGNOSIS — K635 Polyp of colon: Secondary | ICD-10-CM | POA: Diagnosis not present

## 2020-12-13 DIAGNOSIS — K2270 Barrett's esophagus without dysplasia: Secondary | ICD-10-CM | POA: Diagnosis not present

## 2021-01-22 DIAGNOSIS — L120 Bullous pemphigoid: Secondary | ICD-10-CM | POA: Diagnosis not present

## 2021-01-22 DIAGNOSIS — D492 Neoplasm of unspecified behavior of bone, soft tissue, and skin: Secondary | ICD-10-CM | POA: Diagnosis not present

## 2021-01-22 DIAGNOSIS — C44722 Squamous cell carcinoma of skin of right lower limb, including hip: Secondary | ICD-10-CM | POA: Diagnosis not present

## 2021-01-22 DIAGNOSIS — D485 Neoplasm of uncertain behavior of skin: Secondary | ICD-10-CM | POA: Diagnosis not present

## 2021-03-11 DIAGNOSIS — Z85828 Personal history of other malignant neoplasm of skin: Secondary | ICD-10-CM | POA: Diagnosis not present

## 2021-03-11 DIAGNOSIS — Z08 Encounter for follow-up examination after completed treatment for malignant neoplasm: Secondary | ICD-10-CM | POA: Diagnosis not present

## 2021-03-11 DIAGNOSIS — L853 Xerosis cutis: Secondary | ICD-10-CM | POA: Diagnosis not present

## 2021-03-11 DIAGNOSIS — L905 Scar conditions and fibrosis of skin: Secondary | ICD-10-CM | POA: Diagnosis not present

## 2021-03-13 ENCOUNTER — Ambulatory Visit: Payer: Medicare HMO | Admitting: Neurology

## 2021-03-13 ENCOUNTER — Other Ambulatory Visit: Payer: Self-pay

## 2021-03-13 ENCOUNTER — Encounter: Payer: Self-pay | Admitting: Neurology

## 2021-03-13 VITALS — BP 153/82 | HR 96 | Ht 70.0 in | Wt 187.1 lb

## 2021-03-13 DIAGNOSIS — G479 Sleep disorder, unspecified: Secondary | ICD-10-CM

## 2021-03-13 DIAGNOSIS — G20 Parkinson's disease: Secondary | ICD-10-CM | POA: Diagnosis not present

## 2021-03-13 MED ORDER — CARBIDOPA-LEVODOPA 25-100 MG PO TABS: ORAL_TABLET | ORAL | 3 refills | Status: AC

## 2021-03-13 NOTE — Patient Instructions (Signed)
It was nice to see you again today.  You are quite stable at this time but if your tremor flares up, you can certainly try a fourth dose of your Sinemet (carbidopa-levodopa), which is currently 1 pill 3 times daily.  If you find that the fourth dose is helpful, you can always call us and we can change your prescription to allow for you to take 1 pill 4 times daily consistently.  Please follow-up to see Jerome Browner, NP in 6 months.   You can try Melatonin at night for sleep: take 1 mg at bedtime. You can go up to 2 or 3 mg if needed. It is over the counter and comes in pill form, chewable form and spray, if you prefer.

## 2021-03-13 NOTE — Progress Notes (Signed)
Subjective:    Patient ID: Jerome Pham. is a 80 y.o. male.  HPI    Interim history:   Jerome Pham is a 80 year old right-handed gentleman with an underlying medical history of hypertension, reflux disease, vitamin D deficiency, vertigo (with ER evaluation in February 2019) and mildly overweight state, who presents for follow-up consultation of his parkinsonism.  The patient is unaccompanied today.  I last saw Jerome Pham on 09/18/19, at which time Jerome Pham reported having done well after his right shoulder replacement surgery.  Jerome Pham was on Sinemet 1 pill 3 times daily and I recommended that we continue with this.  Jerome Pham saw Ward Givens, NP on 03/18/2020, at which time Jerome Pham was felt to be stable and was advised to continue with his current medication.   Jerome Pham saw Ward Givens, NP on 09/19/2020, at which time Jerome Pham felt stable.  Today, 03/13/2021: Jerome Pham reports that Jerome Pham overall feels stable.  Jerome Pham had a fall in February 2022 and injured his right shoulder again, Jerome Pham did not need surgery.  Jerome Pham tripped over the curb in a parking spot.  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  Jerome Pham has no trouble driving.  Jerome Pham does take a break when Jerome Pham drives a longer distance.  Jerome Pham has regained good range of motion in his shoulder.  Sinemet is 3 times a day and Jerome Pham tolerates it well.  No side effects.  Jerome Pham has had some trouble maintaining sleep.  Jerome Pham goes to sleep well but sometimes Jerome Pham wakes up between 2 and 3 AM and has trouble going back to sleep.  Of note, Jerome Pham lives alone, Jerome Pham does not want to take anything sedating.  Jerome Pham tries to hydrate well but likes to drink unsweetened decaf iced tea.  Jerome Pham denies any constipation issues.  Son and daughter live close by.  The patient's allergies, current medications, family history, past medical history, past social history, past surgical history and problem list were reviewed and updated as appropriate.    Previously:   I saw Jerome Pham on  05/04/2019, at which time Jerome Pham reported that Sinemet was helpful. Unfortunately, Jerome Pham had a fall in December and injured his right shoulder, Jerome Pham needed surgery.    I saw Jerome Pham on 12/29/2018, at which time Jerome Pham reported that his tremor was worse.  Jerome Pham was encouraged to start Sinemet.              Jerome Pham declined a virtual visit in March 2020 and a follow-up from June 2020 was delayed as well. I first met Jerome Pham on 12/23/2017 at the request of his primary care physician, Dr. Kathyrn Lass, at which time the patient reported a several month history, maybe a one-year history of primarily right hand tremor. On examination, Jerome Pham had mild signs of parkinsonism. I suggested we proceed with testing in the form DaT scan. Jerome Pham had had a brain MRI about 6 months prior. Jerome Pham requested to hold off on the nuclear medicine scan and wanted to discuss this with his PCP first.    12/23/2017: (Jerome Pham) is noted to have a hand tremor for the past several months, maybe a year. I reviewed your office records from 10/18/2017, as well as 08/13/2017. Jerome Pham has noticed a tremor in the right hand for the past several months, does not typically bother Jerome Pham, variable in intensity. Jerome Pham has had no recent falls. His most recent episode of vertigo lasted about 2 days and happened about 2  months ago. Prior to that Jerome Pham had an episode of sudden onset of vertigo which also lasted about 48 hours. Jerome Pham was given exercises to do at home which helps. Meclizine did not help very much. Jerome Pham had a brain MRI with and without contrast on 06/03/2017 and I reviewed the results: IMPRESSION: No acute abnormality. No significant ischemia. 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   Jerome Pham is single and lives alone, Jerome Pham is retired. Jerome Pham quit smoking in 1969, drinks alcohol in the form of beer, 2-3 per day on average and one glass of wine. Jerome Pham does not drink  caffeine on a regular basis, maybe one cup of unsweet tea. No FHx of ET or PD. Tries to exercise at least 3 days/week, up to 5 days.  Divorced since 2004 from his second wife, has one son, age 75, in [place].  Has 2 older kids from 85st (ex-wife in [place], and daughter and son here as well, ages 64 and 65). Jerome Pham sleeps fairly well. Jerome Pham has nocturia about 2 times per average night, denies any vivid dreams or dream enactments. Jerome Pham has no issues with constipation, tries to eat healthy.  His Past Medical History Is Significant For: Past Medical History:  Diagnosis Date   Aortic atherosclerosis ([place])    Barrett's esophagus with dysplasia    GERD (gastroesophageal reflux disease)    GERD (gastroesophageal reflux disease)    Hypertension    Vitamin D deficiency     His Past Surgical History Is Significant For: Past Surgical History:  Procedure Laterality Date   CARPAL TUNNEL RELEASE     KNEE SURGERY Right    REVERSE SHOULDER ARTHROPLASTY Right 06/23/2019   Procedure: REVERSE SHOULDER ARTHROPLASTY;  Surgeon: Netta Cedars, MD;  Location: [facility];  Service: Orthopedics;  Laterality: Right;  interscalene block   SHOULDER SURGERY Left    TONSILLECTOMY      His Family History Is Significant For: Family History  Problem Relation Age of Onset   COPD Mother    Heart attack Father        his father died of heart attack at age of 16. All six of his father's brothers died of heart attack.   Parkinson's disease Neg Hx    His Social History Is Significant For: Social History   Socioeconomic History   Marital status: Single    Spouse name: Not on file   Number of children: Not on file   Years of education: Not on file   Highest education level: Not on file  Occupational History   Not on file  Tobacco Use   Smoking status: Former   Smokeless tobacco: Never  Vaping Use   Vaping Use: Never used  Substance and Sexual Activity   Alcohol use: Yes    Alcohol/week: 21.0 standard drinks     Types: 7 Glasses of wine, 14 Cans of beer per week    Comment: nightly   Drug use: No   Sexual activity: Not on file  Other Topics Concern   Not on file  Social History Narrative   Not on file   Social Determinants of Health   Financial Resource Strain: Not on file  Food Insecurity: Not on file  Transportation Needs: Not on file  Physical Activity: Not on file  Stress: Not on file  Social Connections: Not on file    His Allergies Are:  No Known Allergies:   His Current Medications Are:  Outpatient Encounter Medications as of 03/13/2021  Medication Sig   atorvastatin (LIPITOR) 10 MG tablet Take 10 mg by mouth daily.   carbidopa-levodopa (SINEMET IR) 25-100 MG tablet 1 pill 3 times a day.   hydrochlorothiazide (MICROZIDE) 12.5 MG capsule Take 12.5 mg by mouth daily.   losartan (COZAAR) 50 MG tablet Take 50 mg by mouth daily.   omeprazole (PRILOSEC) 10 MG capsule Take 20 mg by mouth daily.    No facility-administered encounter medications on file as of 03/13/2021.  :  Review of Systems:  Out of a complete 14 point review of systems, all are reviewed and negative with the exception of these symptoms as listed below:    Review of Systems  Neurological:        Pt is here for follow up visit for Parkinson's . Pt states no changes since the last appointment 6 months ago . Pt states no questions or concerns for this visit    Objective:  Neurological Exam  Physical Exam Physical Examination:   Vitals:   03/13/21 0838  BP: (!) 153/82  Pulse: 96    General Examination: The patient is a very pleasant 80 y.o. male in no acute distress. Jerome Pham appears well-developed and well-nourished and well groomed.   HEENT: Normocephalic, atraumatic, pupils are equal, round and reactive to light. Hearing is impaired with bilateral hearing aids in place, stable. Face shows mild facial masking. Jerome Pham has no lip, neck or jaw tremor. Mild nuchal rigidity is noted. Jerome Pham has on airway examination mild  mouth dryness, otherwise nonfocal findings, tongue protrudes centrally and palate elevates symmetrically. Speech is clear, no dysarthria, no sialorrhea.  No carotid bruits.   Chest: Clear to auscultation without wheezing, rhonchi or crackles noted.   Heart: S1+S2+0, regular and normal without murmurs, rubs or gallops noted.    Abdomen: Soft, non-tender and non-distended.   Extremities: There is no pitting edema in the distal lower extremities bilaterally.    Skin: Warm and dry without trophic changes noted. Chronic age-related changes noted.   Musculoskeletal: exam reveals mildly decreased range of motion in the right shoulder.     Neurologically:  Mental status: The patient is awake, alert and oriented in all 4 spheres. His immediate and remote memory, attention, language skills and fund of knowledge are appropriate. There is no evidence of aphasia, agnosia, apraxia or anomia. Speech is clear with normal prosody and enunciation. Thought process is linear. Mood is normal and affect is normal.  Cranial nerves II - XII are as described above under HEENT exam. Left shoulder height is a little higher than right.   (On 12/23/2017: on Archimedes spiral drawing Jerome Pham has no significant difficulty with the right hand, Jerome Pham has coarse difficulty with the left/nondominant hand, handwriting with his right hand is slightly on the smaller side, legible, not particularly tremulous.)   Motor exam: Normal bulk, normal strength for age, mild increase in tone in the right upper extremity with some cogwheeling noted. Jerome Pham has a moderate resting tremor in the right upper extremity, a slight and intermittent resting tremor in the left upper extremity.  Jerome Pham has a minimal postural tremor in both upper extremities, no significant action tremor, no intention tremor. Fine motor skills show mild mild to moderate difficulty on the right, better on the left.   Jerome Pham stands without difficulty, posture is stooped tilted and increase in  lumbar kyphosis.  Jerome Pham walks with fairly good pace and stride  length, decreased arm swing and more pronounced tremor on the right upper extremity. Jerome Pham turns well, but holds onto the exam table while turning, reports that it helps relieve some of the back discomfort.  Jerome Pham does not use a walking aids and feels no need for a cane.  Cerebellar testing: No dysmetria or intention tremor on finger to nose testing. There is no truncal or gait ataxia.  Sensory exam: intact to light touch.                Assessment and Plan:    In summary, Micharl Helmes. 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  Jerome Pham has responded to Sinemet and continues to take 1 pill 3 times daily.  Jerome Pham tolerates the medication and has had no new symptoms, with the exception of sleep maintenance difficulty.  Jerome Pham has had low back pain, Jerome Pham injured his right shoulder again earlier this year when Jerome Pham fell but Jerome Pham did not broke it at the time, Jerome Pham reports that it have to be immobilized and Jerome Pham had to do a lot of things with his nondominant arm and hand.  Jerome Pham jokingly reports that Jerome Pham is almost ambidextrous at this time.  Jerome Pham continues to drive and works part-time as a Geophysicist/field seismologist.  Jerome Pham is confident with his driving.  Jerome Pham is again reminded to pace himself and monitor his driving skills, take a break if Jerome Pham is on a longer ride. For sleep, Jerome Pham is advised to use melatonin at night at bedtime, 1 mg strength, may increase up to 3 mg.  Jerome Pham was given written instructions and I renewed his Sinemet at 1 pill 3 times daily.  We did talk about the possibility of going up to a fourth dose if his tremor flares up consistently.  Jerome Pham is encouraged to try this for a few days and if it helps, Jerome Pham can always call us and we can  adjust the prescription.  Jerome Pham was given these instructions in writing as well.  Jerome Pham is advised to continue to stay well-hydrated and pursue healthy lifestyle.  Jerome Pham is advised to follow-up with the nurse practitioner in about 6 months, sooner if needed.  I answered all his questions today and Jerome Pham was in agreement.  I spent 30 minutes in total face-to-face time and in reviewing records during pre-charting, more than 50% of which was spent in counseling and coordination of care, reviewing test results, reviewing medications and treatment regimen and/or in discussing or reviewing the diagnosis of PD, the prognosis and treatment options. Pertinent laboratory and imaging test results that were available during this visit with the patient were reviewed by me and considered in my medical decision making (see chart for details).

## 2021-03-26 ENCOUNTER — Ambulatory Visit: Payer: Medicare HMO | Admitting: Neurology

## 2021-03-29 DIAGNOSIS — B349 Viral infection, unspecified: Secondary | ICD-10-CM | POA: Diagnosis not present

## 2021-03-29 DIAGNOSIS — U071 COVID-19: Secondary | ICD-10-CM | POA: Diagnosis not present

## 2021-04-22 DIAGNOSIS — L298 Other pruritus: Secondary | ICD-10-CM | POA: Diagnosis not present

## 2021-04-22 DIAGNOSIS — L120 Bullous pemphigoid: Secondary | ICD-10-CM | POA: Diagnosis not present

## 2021-05-13 DIAGNOSIS — I700 Atherosclerosis of aorta: Secondary | ICD-10-CM | POA: Diagnosis not present

## 2021-05-13 DIAGNOSIS — Z0000 Encounter for general adult medical examination without abnormal findings: Secondary | ICD-10-CM | POA: Diagnosis not present

## 2021-05-13 DIAGNOSIS — I10 Essential (primary) hypertension: Secondary | ICD-10-CM | POA: Diagnosis not present

## 2021-05-13 DIAGNOSIS — E559 Vitamin D deficiency, unspecified: Secondary | ICD-10-CM | POA: Diagnosis not present

## 2021-05-13 DIAGNOSIS — Z1389 Encounter for screening for other disorder: Secondary | ICD-10-CM | POA: Diagnosis not present

## 2021-05-13 DIAGNOSIS — E538 Deficiency of other specified B group vitamins: Secondary | ICD-10-CM | POA: Diagnosis not present

## 2021-06-03 DIAGNOSIS — Z08 Encounter for follow-up examination after completed treatment for malignant neoplasm: Secondary | ICD-10-CM | POA: Diagnosis not present

## 2021-06-03 DIAGNOSIS — L120 Bullous pemphigoid: Secondary | ICD-10-CM | POA: Diagnosis not present

## 2021-06-03 DIAGNOSIS — Z85828 Personal history of other malignant neoplasm of skin: Secondary | ICD-10-CM | POA: Diagnosis not present

## 2021-07-12 DIAGNOSIS — J069 Acute upper respiratory infection, unspecified: Secondary | ICD-10-CM | POA: Diagnosis not present

## 2021-07-12 DIAGNOSIS — G4700 Insomnia, unspecified: Secondary | ICD-10-CM | POA: Diagnosis not present

## 2021-07-15 DIAGNOSIS — Z85828 Personal history of other malignant neoplasm of skin: Secondary | ICD-10-CM | POA: Diagnosis not present

## 2021-07-15 DIAGNOSIS — Z08 Encounter for follow-up examination after completed treatment for malignant neoplasm: Secondary | ICD-10-CM | POA: Diagnosis not present

## 2021-07-15 DIAGNOSIS — L120 Bullous pemphigoid: Secondary | ICD-10-CM | POA: Diagnosis not present

## 2021-07-15 DIAGNOSIS — I872 Venous insufficiency (chronic) (peripheral): Secondary | ICD-10-CM | POA: Diagnosis not present

## 2021-08-18 DIAGNOSIS — R2689 Other abnormalities of gait and mobility: Secondary | ICD-10-CM | POA: Diagnosis not present

## 2021-08-18 DIAGNOSIS — R4789 Other speech disturbances: Secondary | ICD-10-CM | POA: Diagnosis not present

## 2021-08-18 DIAGNOSIS — N3944 Nocturnal enuresis: Secondary | ICD-10-CM | POA: Diagnosis not present

## 2021-08-18 DIAGNOSIS — Z79899 Other long term (current) drug therapy: Secondary | ICD-10-CM | POA: Diagnosis not present

## 2021-08-18 DIAGNOSIS — R296 Repeated falls: Secondary | ICD-10-CM | POA: Diagnosis not present

## 2021-08-18 DIAGNOSIS — R600 Localized edema: Secondary | ICD-10-CM | POA: Diagnosis not present

## 2021-08-18 DIAGNOSIS — E663 Overweight: Secondary | ICD-10-CM | POA: Diagnosis not present

## 2021-08-18 DIAGNOSIS — I10 Essential (primary) hypertension: Secondary | ICD-10-CM | POA: Diagnosis not present

## 2021-08-18 DIAGNOSIS — G20 Parkinson's disease: Secondary | ICD-10-CM | POA: Diagnosis not present

## 2021-08-18 DIAGNOSIS — I700 Atherosclerosis of aorta: Secondary | ICD-10-CM | POA: Diagnosis not present

## 2021-08-22 ENCOUNTER — Other Ambulatory Visit: Payer: Self-pay

## 2021-08-22 ENCOUNTER — Telehealth: Payer: Self-pay | Admitting: Neurology

## 2021-08-22 ENCOUNTER — Encounter (HOSPITAL_COMMUNITY): Admission: EM | Disposition: E | Payer: Self-pay | Source: Home / Self Care | Attending: Internal Medicine

## 2021-08-22 ENCOUNTER — Emergency Department (HOSPITAL_COMMUNITY): Payer: Medicare HMO

## 2021-08-22 ENCOUNTER — Encounter (HOSPITAL_COMMUNITY): Payer: Self-pay | Admitting: Certified Registered"

## 2021-08-22 ENCOUNTER — Inpatient Hospital Stay (HOSPITAL_COMMUNITY): Payer: Medicare HMO

## 2021-08-22 ENCOUNTER — Inpatient Hospital Stay (HOSPITAL_COMMUNITY)
Admission: EM | Admit: 2021-08-22 | Discharge: 2021-08-28 | DRG: 270 | Disposition: E | Payer: Medicare HMO | Attending: Internal Medicine | Admitting: Internal Medicine

## 2021-08-22 ENCOUNTER — Emergency Department (EMERGENCY_DEPARTMENT_HOSPITAL): Payer: Medicare HMO | Admitting: Anesthesiology

## 2021-08-22 ENCOUNTER — Emergency Department (HOSPITAL_COMMUNITY): Payer: Medicare HMO | Admitting: Anesthesiology

## 2021-08-22 DIAGNOSIS — I10 Essential (primary) hypertension: Secondary | ICD-10-CM | POA: Diagnosis present

## 2021-08-22 DIAGNOSIS — E441 Mild protein-calorie malnutrition: Secondary | ICD-10-CM | POA: Diagnosis not present

## 2021-08-22 DIAGNOSIS — Z4682 Encounter for fitting and adjustment of non-vascular catheter: Secondary | ICD-10-CM | POA: Diagnosis not present

## 2021-08-22 DIAGNOSIS — M4802 Spinal stenosis, cervical region: Secondary | ICD-10-CM | POA: Diagnosis not present

## 2021-08-22 DIAGNOSIS — Z8249 Family history of ischemic heart disease and other diseases of the circulatory system: Secondary | ICD-10-CM

## 2021-08-22 DIAGNOSIS — K7200 Acute and subacute hepatic failure without coma: Secondary | ICD-10-CM | POA: Diagnosis not present

## 2021-08-22 DIAGNOSIS — G9349 Other encephalopathy: Secondary | ICD-10-CM | POA: Diagnosis not present

## 2021-08-22 DIAGNOSIS — M4313 Spondylolisthesis, cervicothoracic region: Secondary | ICD-10-CM | POA: Diagnosis not present

## 2021-08-22 DIAGNOSIS — I248 Other forms of acute ischemic heart disease: Secondary | ICD-10-CM | POA: Diagnosis present

## 2021-08-22 DIAGNOSIS — K219 Gastro-esophageal reflux disease without esophagitis: Secondary | ICD-10-CM | POA: Diagnosis present

## 2021-08-22 DIAGNOSIS — I824Z1 Acute embolism and thrombosis of unspecified deep veins of right distal lower extremity: Secondary | ICD-10-CM | POA: Diagnosis not present

## 2021-08-22 DIAGNOSIS — J690 Pneumonitis due to inhalation of food and vomit: Secondary | ICD-10-CM | POA: Diagnosis present

## 2021-08-22 DIAGNOSIS — R6510 Systemic inflammatory response syndrome (SIRS) of non-infectious origin without acute organ dysfunction: Secondary | ICD-10-CM | POA: Diagnosis present

## 2021-08-22 DIAGNOSIS — Z6825 Body mass index (BMI) 25.0-25.9, adult: Secondary | ICD-10-CM

## 2021-08-22 DIAGNOSIS — J95821 Acute postprocedural respiratory failure: Secondary | ICD-10-CM

## 2021-08-22 DIAGNOSIS — I70221 Atherosclerosis of native arteries of extremities with rest pain, right leg: Secondary | ICD-10-CM | POA: Diagnosis not present

## 2021-08-22 DIAGNOSIS — J9690 Respiratory failure, unspecified, unspecified whether with hypoxia or hypercapnia: Secondary | ICD-10-CM | POA: Diagnosis not present

## 2021-08-22 DIAGNOSIS — I4891 Unspecified atrial fibrillation: Secondary | ICD-10-CM | POA: Diagnosis not present

## 2021-08-22 DIAGNOSIS — I700 Atherosclerosis of aorta: Secondary | ICD-10-CM | POA: Diagnosis present

## 2021-08-22 DIAGNOSIS — S0990XA Unspecified injury of head, initial encounter: Secondary | ICD-10-CM | POA: Diagnosis not present

## 2021-08-22 DIAGNOSIS — E872 Acidosis, unspecified: Secondary | ICD-10-CM | POA: Diagnosis present

## 2021-08-22 DIAGNOSIS — W19XXXA Unspecified fall, initial encounter: Secondary | ICD-10-CM | POA: Diagnosis present

## 2021-08-22 DIAGNOSIS — R000 Tachycardia, unspecified: Secondary | ICD-10-CM | POA: Diagnosis present

## 2021-08-22 DIAGNOSIS — J9811 Atelectasis: Secondary | ICD-10-CM | POA: Diagnosis present

## 2021-08-22 DIAGNOSIS — Z79899 Other long term (current) drug therapy: Secondary | ICD-10-CM

## 2021-08-22 DIAGNOSIS — Z043 Encounter for examination and observation following other accident: Secondary | ICD-10-CM | POA: Diagnosis not present

## 2021-08-22 DIAGNOSIS — Z96611 Presence of right artificial shoulder joint: Secondary | ICD-10-CM | POA: Diagnosis present

## 2021-08-22 DIAGNOSIS — R578 Other shock: Secondary | ICD-10-CM | POA: Diagnosis not present

## 2021-08-22 DIAGNOSIS — M2578 Osteophyte, vertebrae: Secondary | ICD-10-CM | POA: Diagnosis not present

## 2021-08-22 DIAGNOSIS — G20 Parkinson's disease: Secondary | ICD-10-CM | POA: Diagnosis present

## 2021-08-22 DIAGNOSIS — J9601 Acute respiratory failure with hypoxia: Secondary | ICD-10-CM | POA: Diagnosis present

## 2021-08-22 DIAGNOSIS — M6282 Rhabdomyolysis: Secondary | ICD-10-CM | POA: Diagnosis present

## 2021-08-22 DIAGNOSIS — Z515 Encounter for palliative care: Secondary | ICD-10-CM

## 2021-08-22 DIAGNOSIS — I739 Peripheral vascular disease, unspecified: Secondary | ICD-10-CM | POA: Diagnosis not present

## 2021-08-22 DIAGNOSIS — I998 Other disorder of circulatory system: Secondary | ICD-10-CM | POA: Diagnosis present

## 2021-08-22 DIAGNOSIS — R34 Anuria and oliguria: Secondary | ICD-10-CM | POA: Diagnosis present

## 2021-08-22 DIAGNOSIS — Z66 Do not resuscitate: Secondary | ICD-10-CM | POA: Diagnosis not present

## 2021-08-22 DIAGNOSIS — R0689 Other abnormalities of breathing: Secondary | ICD-10-CM | POA: Diagnosis not present

## 2021-08-22 DIAGNOSIS — R7401 Elevation of levels of liver transaminase levels: Secondary | ICD-10-CM | POA: Diagnosis present

## 2021-08-22 DIAGNOSIS — J320 Chronic maxillary sinusitis: Secondary | ICD-10-CM | POA: Diagnosis not present

## 2021-08-22 DIAGNOSIS — N179 Acute kidney failure, unspecified: Secondary | ICD-10-CM | POA: Diagnosis present

## 2021-08-22 DIAGNOSIS — I743 Embolism and thrombosis of arteries of the lower extremities: Secondary | ICD-10-CM | POA: Diagnosis present

## 2021-08-22 DIAGNOSIS — I959 Hypotension, unspecified: Secondary | ICD-10-CM | POA: Diagnosis present

## 2021-08-22 DIAGNOSIS — R627 Adult failure to thrive: Secondary | ICD-10-CM | POA: Diagnosis present

## 2021-08-22 DIAGNOSIS — R0902 Hypoxemia: Secondary | ICD-10-CM | POA: Diagnosis not present

## 2021-08-22 DIAGNOSIS — M160 Bilateral primary osteoarthritis of hip: Secondary | ICD-10-CM | POA: Diagnosis not present

## 2021-08-22 DIAGNOSIS — E870 Hyperosmolality and hypernatremia: Secondary | ICD-10-CM

## 2021-08-22 DIAGNOSIS — J90 Pleural effusion, not elsewhere classified: Secondary | ICD-10-CM | POA: Diagnosis not present

## 2021-08-22 DIAGNOSIS — Y92009 Unspecified place in unspecified non-institutional (private) residence as the place of occurrence of the external cause: Secondary | ICD-10-CM

## 2021-08-22 DIAGNOSIS — M62262 Nontraumatic ischemic infarction of muscle, left lower leg: Secondary | ICD-10-CM | POA: Diagnosis not present

## 2021-08-22 DIAGNOSIS — S199XXA Unspecified injury of neck, initial encounter: Secondary | ICD-10-CM | POA: Diagnosis not present

## 2021-08-22 DIAGNOSIS — R918 Other nonspecific abnormal finding of lung field: Secondary | ICD-10-CM | POA: Diagnosis not present

## 2021-08-22 DIAGNOSIS — Z87891 Personal history of nicotine dependence: Secondary | ICD-10-CM

## 2021-08-22 DIAGNOSIS — E875 Hyperkalemia: Secondary | ICD-10-CM | POA: Diagnosis not present

## 2021-08-22 DIAGNOSIS — J323 Chronic sphenoidal sinusitis: Secondary | ICD-10-CM | POA: Diagnosis not present

## 2021-08-22 DIAGNOSIS — I499 Cardiac arrhythmia, unspecified: Secondary | ICD-10-CM | POA: Diagnosis not present

## 2021-08-22 HISTORY — PX: APPLICATION OF WOUND VAC: SHX5189

## 2021-08-22 HISTORY — PX: THROMBECTOMY FEMORAL ARTERY: SHX6406

## 2021-08-22 HISTORY — PX: FASCIOTOMY: SHX132

## 2021-08-22 LAB — COMPREHENSIVE METABOLIC PANEL
ALT: 11 U/L (ref 0–44)
AST: 70 U/L — ABNORMAL HIGH (ref 15–41)
Albumin: 3.4 g/dL — ABNORMAL LOW (ref 3.5–5.0)
Alkaline Phosphatase: 43 U/L (ref 38–126)
Anion gap: 15 (ref 5–15)
BUN: 44 mg/dL — ABNORMAL HIGH (ref 8–23)
CO2: 24 mmol/L (ref 22–32)
Calcium: 8.9 mg/dL (ref 8.9–10.3)
Chloride: 114 mmol/L — ABNORMAL HIGH (ref 98–111)
Creatinine, Ser: 1.33 mg/dL — ABNORMAL HIGH (ref 0.61–1.24)
GFR, Estimated: 54 mL/min — ABNORMAL LOW (ref >60)
Glucose, Bld: 105 mg/dL — ABNORMAL HIGH (ref 70–99)
Potassium: 3.5 mmol/L (ref 3.5–5.1)
Sodium: 153 mmol/L — ABNORMAL HIGH (ref 135–145)
Total Bilirubin: 5.1 mg/dL — ABNORMAL HIGH (ref 0.3–1.2)
Total Protein: 6.2 g/dL — ABNORMAL LOW (ref 6.5–8.1)

## 2021-08-22 LAB — CBC
HCT: 45.2 % (ref 39.0–52.0)
Hemoglobin: 14.6 g/dL (ref 13.0–17.0)
MCH: 31.7 pg (ref 26.0–34.0)
MCHC: 32.3 g/dL (ref 30.0–36.0)
MCV: 98 fL (ref 80.0–100.0)
Platelets: 223 10*3/uL (ref 150–400)
RBC: 4.61 MIL/uL (ref 4.22–5.81)
RDW: 16.4 % — ABNORMAL HIGH (ref 11.5–15.5)
WBC: 11.7 10*3/uL — ABNORMAL HIGH (ref 4.0–10.5)
nRBC: 0 % (ref 0.0–0.2)

## 2021-08-22 LAB — I-STAT VENOUS BLOOD GAS, ED
Acid-Base Excess: 3 mmol/L — ABNORMAL HIGH (ref 0.0–2.0)
Bicarbonate: 27.5 mmol/L (ref 20.0–28.0)
Calcium, Ion: 1.01 mmol/L — ABNORMAL LOW (ref 1.15–1.40)
HCT: 46 % (ref 39.0–52.0)
Hemoglobin: 15.6 g/dL (ref 13.0–17.0)
O2 Saturation: 66 %
Potassium: 3.4 mmol/L — ABNORMAL LOW (ref 3.5–5.1)
Sodium: 152 mmol/L — ABNORMAL HIGH (ref 135–145)
TCO2: 29 mmol/L (ref 22–32)
pCO2, Ven: 41.5 mmHg — ABNORMAL LOW (ref 44–60)
pH, Ven: 7.429 (ref 7.25–7.43)
pO2, Ven: 33 mmHg (ref 32–45)

## 2021-08-22 LAB — I-STAT CHEM 8, ED
BUN: 43 mg/dL — ABNORMAL HIGH (ref 8–23)
Calcium, Ion: 1 mmol/L — ABNORMAL LOW (ref 1.15–1.40)
Chloride: 115 mmol/L — ABNORMAL HIGH (ref 98–111)
Creatinine, Ser: 1.2 mg/dL (ref 0.61–1.24)
Glucose, Bld: 100 mg/dL — ABNORMAL HIGH (ref 70–99)
HCT: 47 % (ref 39.0–52.0)
Hemoglobin: 16 g/dL (ref 13.0–17.0)
Potassium: 3.4 mmol/L — ABNORMAL LOW (ref 3.5–5.1)
Sodium: 152 mmol/L — ABNORMAL HIGH (ref 135–145)
TCO2: 25 mmol/L (ref 22–32)

## 2021-08-22 LAB — POCT I-STAT 7, (LYTES, BLD GAS, ICA,H+H)
Acid-Base Excess: 2 mmol/L (ref 0.0–2.0)
Acid-base deficit: 3 mmol/L — ABNORMAL HIGH (ref 0.0–2.0)
Acid-base deficit: 2 mmol/L (ref 0.0–2.0)
Acid-base deficit: 2 mmol/L (ref 0.0–2.0)
Bicarbonate: 26.9 mmol/L (ref 20.0–28.0)
Bicarbonate: 23.2 mmol/L (ref 20.0–28.0)
Bicarbonate: 21 mmol/L (ref 20.0–28.0)
Bicarbonate: 22.1 mmol/L (ref 20.0–28.0)
Calcium, Ion: 1.16 mmol/L (ref 1.15–1.40)
Calcium, Ion: 1.23 mmol/L (ref 1.15–1.40)
Calcium, Ion: 0.79 mmol/L — CL (ref 1.15–1.40)
Calcium, Ion: 1.15 mmol/L (ref 1.15–1.40)
HCT: 46 % (ref 39.0–52.0)
HCT: 44 % (ref 39.0–52.0)
HCT: 42 % (ref 39.0–52.0)
HCT: 47 % (ref 39.0–52.0)
Hemoglobin: 15.6 g/dL (ref 13.0–17.0)
Hemoglobin: 15 g/dL (ref 13.0–17.0)
Hemoglobin: 14.3 g/dL (ref 13.0–17.0)
Hemoglobin: 16 g/dL (ref 13.0–17.0)
O2 Saturation: 100 %
O2 Saturation: 97 %
O2 Saturation: 94 %
O2 Saturation: 84 %
Patient temperature: 36.5
Patient temperature: 98.2
Patient temperature: 98.2
Potassium: 3.8 mmol/L (ref 3.5–5.1)
Potassium: 5.1 mmol/L (ref 3.5–5.1)
Potassium: >8.5 mmol/L (ref 3.5–5.1)
Potassium: 4.5 mmol/L (ref 3.5–5.1)
Sodium: 151 mmol/L — ABNORMAL HIGH (ref 135–145)
Sodium: 148 mmol/L — ABNORMAL HIGH (ref 135–145)
Sodium: 139 mmol/L (ref 135–145)
Sodium: 148 mmol/L — ABNORMAL HIGH (ref 135–145)
TCO2: 28 mmol/L (ref 22–32)
TCO2: 25 mmol/L (ref 22–32)
TCO2: 22 mmol/L (ref 22–32)
TCO2: 23 mmol/L (ref 22–32)
pCO2 arterial: 42.7 mmHg (ref 32–48)
pCO2 arterial: 44.3 mmHg (ref 32–48)
pCO2 arterial: 30.8 mmHg — ABNORMAL LOW (ref 32–48)
pCO2 arterial: 36.3 mmHg (ref 32–48)
pH, Arterial: 7.405 (ref 7.35–7.45)
pH, Arterial: 7.327 — ABNORMAL LOW (ref 7.35–7.45)
pH, Arterial: 7.44 (ref 7.35–7.45)
pH, Arterial: 7.392 (ref 7.35–7.45)
pO2, Arterial: 200 mmHg — ABNORMAL HIGH (ref 83–108)
pO2, Arterial: 93 mmHg (ref 83–108)
pO2, Arterial: 65 mmHg — ABNORMAL LOW (ref 83–108)
pO2, Arterial: 48 mmHg — ABNORMAL LOW (ref 83–108)

## 2021-08-22 LAB — BASIC METABOLIC PANEL
Anion gap: 12 (ref 5–15)
BUN: 40 mg/dL — ABNORMAL HIGH (ref 8–23)
CO2: 20 mmol/L — ABNORMAL LOW (ref 22–32)
Calcium: 8.6 mg/dL — ABNORMAL LOW (ref 8.9–10.3)
Chloride: 114 mmol/L — ABNORMAL HIGH (ref 98–111)
Creatinine, Ser: 1.37 mg/dL — ABNORMAL HIGH (ref 0.61–1.24)
GFR, Estimated: 52 mL/min — ABNORMAL LOW (ref >60)
Glucose, Bld: 168 mg/dL — ABNORMAL HIGH (ref 70–99)
Potassium: 4.8 mmol/L (ref 3.5–5.1)
Sodium: 146 mmol/L — ABNORMAL HIGH (ref 135–145)

## 2021-08-22 LAB — PROTIME-INR
INR: 1.4 — ABNORMAL HIGH (ref 0.8–1.2)
Prothrombin Time: 17.1 seconds — ABNORMAL HIGH (ref 11.4–15.2)

## 2021-08-22 LAB — CBC WITH DIFFERENTIAL/PLATELET
Abs Immature Granulocytes: 0.06 10*3/uL (ref 0.00–0.07)
Basophils Absolute: 0 10*3/uL (ref 0.0–0.1)
Basophils Relative: 0 %
Eosinophils Absolute: 0 10*3/uL (ref 0.0–0.5)
Eosinophils Relative: 0 %
HCT: 50.8 % (ref 39.0–52.0)
Hemoglobin: 15.1 g/dL (ref 13.0–17.0)
Immature Granulocytes: 1 %
Lymphocytes Relative: 9 %
Lymphs Abs: 0.8 10*3/uL (ref 0.7–4.0)
MCH: 30.4 pg (ref 26.0–34.0)
MCHC: 29.7 g/dL — ABNORMAL LOW (ref 30.0–36.0)
MCV: 102.4 fL — ABNORMAL HIGH (ref 80.0–100.0)
Monocytes Absolute: 0.8 10*3/uL (ref 0.1–1.0)
Monocytes Relative: 8 %
Neutro Abs: 7.3 10*3/uL (ref 1.7–7.7)
Neutrophils Relative %: 82 %
Platelets: 199 10*3/uL (ref 150–400)
RBC: 4.96 MIL/uL (ref 4.22–5.81)
RDW: 16.7 % — ABNORMAL HIGH (ref 11.5–15.5)
WBC: 9 10*3/uL (ref 4.0–10.5)
nRBC: 0 % (ref 0.0–0.2)

## 2021-08-22 LAB — LACTIC ACID, PLASMA
Lactic Acid, Venous: 3.2 mmol/L (ref 0.5–1.9)
Lactic Acid, Venous: 3 mmol/L (ref 0.5–1.9)

## 2021-08-22 LAB — TROPONIN I (HIGH SENSITIVITY): Troponin I (High Sensitivity): 282 ng/L (ref <18)

## 2021-08-22 LAB — HEPARIN LEVEL (UNFRACTIONATED): Heparin Unfractionated: 0.98 IU/mL — ABNORMAL HIGH (ref 0.30–0.70)

## 2021-08-22 LAB — GLUCOSE, CAPILLARY: Glucose-Capillary: 122 mg/dL — ABNORMAL HIGH (ref 70–99)

## 2021-08-22 LAB — CK: Total CK: 2834 U/L — ABNORMAL HIGH (ref 49–397)

## 2021-08-22 LAB — MAGNESIUM
Magnesium: 1.8 mg/dL (ref 1.7–2.4)
Magnesium: 1.9 mg/dL (ref 1.7–2.4)

## 2021-08-22 LAB — PHOSPHORUS: Phosphorus: 6.4 mg/dL — ABNORMAL HIGH (ref 2.5–4.6)

## 2021-08-22 LAB — APTT: aPTT: 157 seconds — ABNORMAL HIGH (ref 24–36)

## 2021-08-22 SURGERY — THROMBECTOMY, ARTERY, FEMORAL
Anesthesia: General | Site: Leg Lower | Laterality: Right

## 2021-08-22 MED ORDER — FENTANYL CITRATE PF 50 MCG/ML IJ SOSY
25.0000 ug | PREFILLED_SYRINGE | INTRAMUSCULAR | Status: DC | PRN
  Administered 2021-08-22: 100 ug via INTRAVENOUS
  Filled 2021-08-22: qty 2

## 2021-08-22 MED ORDER — SODIUM CHLORIDE 0.9 % IV SOLN: 3.0000 g | Freq: Four times a day (QID) | INTRAVENOUS | Status: DC

## 2021-08-22 MED ORDER — LIDOCAINE 2% (20 MG/ML) 5 ML SYRINGE
INTRAMUSCULAR | Status: AC
  Filled 2021-08-22: qty 5

## 2021-08-22 MED ORDER — CALCIUM CHLORIDE 10 % IV SOLN
INTRAVENOUS | Status: AC
  Filled 2021-08-22: qty 10

## 2021-08-22 MED ORDER — CEFAZOLIN SODIUM 1 G IJ SOLR
INTRAMUSCULAR | Status: AC
  Filled 2021-08-22: qty 20

## 2021-08-22 MED ORDER — ALBUMIN HUMAN 5 % IV SOLN: INTRAVENOUS | Status: DC | PRN

## 2021-08-22 MED ORDER — AMIODARONE HCL IN DEXTROSE 360-4.14 MG/200ML-% IV SOLN
30.0000 mg/h | INTRAVENOUS | Status: DC
  Administered 2021-08-23 (×3): 30 mg/h via INTRAVENOUS
  Filled 2021-08-22: qty 200

## 2021-08-22 MED ORDER — LIDOCAINE HCL (CARDIAC) PF 100 MG/5ML IV SOSY
PREFILLED_SYRINGE | INTRAVENOUS | Status: DC | PRN
  Administered 2021-08-22: 80 mg via INTRATRACHEAL

## 2021-08-22 MED ORDER — FENTANYL CITRATE PF 50 MCG/ML IJ SOSY: 25.0000 ug | PREFILLED_SYRINGE | INTRAMUSCULAR | Status: DC | PRN

## 2021-08-22 MED ORDER — CARBIDOPA-LEVODOPA 25-100 MG PO TABS
1.0000 | ORAL_TABLET | Freq: Three times a day (TID) | ORAL | Status: DC
  Administered 2021-08-23 (×2): 1
  Filled 2021-08-22 (×5): qty 1

## 2021-08-22 MED ORDER — PROPOFOL 1000 MG/100ML IV EMUL
0.0000 ug/kg/min | INTRAVENOUS | Status: DC
  Administered 2021-08-22: 25 ug/kg/min via INTRAVENOUS
  Filled 2021-08-22: qty 100

## 2021-08-22 MED ORDER — DOCUSATE SODIUM 50 MG/5ML PO LIQD
100.0000 mg | Freq: Two times a day (BID) | ORAL | Status: DC
  Administered 2021-08-23: 100 mg
  Filled 2021-08-22: qty 10

## 2021-08-22 MED ORDER — AMIODARONE LOAD VIA INFUSION
150.0000 mg | Freq: Once | INTRAVENOUS | Status: AC
  Administered 2021-08-22: 150 mg via INTRAVENOUS
  Filled 2021-08-22: qty 83.34

## 2021-08-22 MED ORDER — 0.9 % SODIUM CHLORIDE (POUR BTL) OPTIME
TOPICAL | Status: DC | PRN
  Administered 2021-08-22: 2000 mL

## 2021-08-22 MED ORDER — NOREPINEPHRINE 4 MG/250ML-% IV SOLN
INTRAVENOUS | Status: DC | PRN
  Administered 2021-08-22: 4 ug/min via INTRAVENOUS

## 2021-08-22 MED ORDER — ETOMIDATE 2 MG/ML IV SOLN
INTRAVENOUS | Status: DC | PRN
  Administered 2021-08-22: 18 mg via INTRAVENOUS

## 2021-08-22 MED ORDER — ROCURONIUM BROMIDE 100 MG/10ML IV SOLN
INTRAVENOUS | Status: DC | PRN
  Administered 2021-08-22: 30 mg via INTRAVENOUS
  Administered 2021-08-22: 50 mg via INTRAVENOUS

## 2021-08-22 MED ORDER — NOREPINEPHRINE BITARTRATE 1 MG/ML IV SOLN
INTRAVENOUS | Status: DC | PRN
  Administered 2021-08-22: 1 mL via INTRAVENOUS

## 2021-08-22 MED ORDER — HEPARIN 6000 UNIT IRRIGATION SOLUTION
Status: DC | PRN
  Administered 2021-08-22: 1

## 2021-08-22 MED ORDER — PHENYLEPHRINE 80 MCG/ML (10ML) SYRINGE FOR IV PUSH (FOR BLOOD PRESSURE SUPPORT)
PREFILLED_SYRINGE | INTRAVENOUS | Status: AC
  Filled 2021-08-22: qty 30

## 2021-08-22 MED ORDER — ONDANSETRON HCL 4 MG/2ML IJ SOLN
INTRAMUSCULAR | Status: AC
  Filled 2021-08-22: qty 2

## 2021-08-22 MED ORDER — DILTIAZEM HCL-DEXTROSE 125-5 MG/125ML-% IV SOLN (PREMIX)
5.0000 mg/h | INTRAVENOUS | Status: DC
  Administered 2021-08-22: 5 mg/h via INTRAVENOUS
  Filled 2021-08-22 (×2): qty 125

## 2021-08-22 MED ORDER — LACTATED RINGERS IV SOLN: INTRAVENOUS | Status: DC

## 2021-08-22 MED ORDER — SODIUM CHLORIDE 0.9 % IV BOLUS: 1000.0000 mL | Freq: Once | INTRAVENOUS | Status: DC

## 2021-08-22 MED ORDER — CALCIUM CHLORIDE 10 % IV SOLN
INTRAVENOUS | Status: DC | PRN
  Administered 2021-08-22 (×5): 200 mg via INTRAVENOUS

## 2021-08-22 MED ORDER — FENTANYL 2500MCG IN NS 250ML (10MCG/ML) PREMIX INFUSION
25.0000 ug/h | INTRAVENOUS | Status: DC
  Administered 2021-08-23: 100 ug/h via INTRAVENOUS
  Filled 2021-08-22 (×2): qty 250

## 2021-08-22 MED ORDER — HEPARIN SODIUM (PORCINE) 1000 UNIT/ML IJ SOLN
INTRAMUSCULAR | Status: AC
  Filled 2021-08-22: qty 10

## 2021-08-22 MED ORDER — EPINEPHRINE 0.1 MG/10ML (10 MCG/ML) SYRINGE FOR IV PUSH (FOR BLOOD PRESSURE SUPPORT): 5.0000 ug | PREFILLED_SYRINGE | Freq: Once | INTRAVENOUS | Status: DC | PRN

## 2021-08-22 MED ORDER — FENTANYL CITRATE (PF) 250 MCG/5ML IJ SOLN
INTRAMUSCULAR | Status: AC
  Filled 2021-08-22: qty 5

## 2021-08-22 MED ORDER — PANTOPRAZOLE SODIUM 40 MG IV SOLR: 40.0000 mg | Freq: Every day | INTRAVENOUS | Status: DC

## 2021-08-22 MED ORDER — FENTANYL CITRATE PF 50 MCG/ML IJ SOSY: 25.0000 ug | PREFILLED_SYRINGE | Freq: Once | INTRAMUSCULAR | Status: DC

## 2021-08-22 MED ORDER — CEFAZOLIN SODIUM-DEXTROSE 2-3 GM-%(50ML) IV SOLR
INTRAVENOUS | Status: DC | PRN
  Administered 2021-08-22: 2 g via INTRAVENOUS

## 2021-08-22 MED ORDER — ETOMIDATE 2 MG/ML IV SOLN
INTRAVENOUS | Status: AC
  Filled 2021-08-22: qty 10

## 2021-08-22 MED ORDER — ROCURONIUM BROMIDE 10 MG/ML (PF) SYRINGE
PREFILLED_SYRINGE | INTRAVENOUS | Status: AC
  Filled 2021-08-22: qty 10

## 2021-08-22 MED ORDER — SUCCINYLCHOLINE CHLORIDE 200 MG/10ML IV SOSY
PREFILLED_SYRINGE | INTRAVENOUS | Status: DC | PRN
  Administered 2021-08-22: 120 mg via INTRAVENOUS

## 2021-08-22 MED ORDER — PHENYLEPHRINE 80 MCG/ML (10ML) SYRINGE FOR IV PUSH (FOR BLOOD PRESSURE SUPPORT): 80.0000 ug | PREFILLED_SYRINGE | Freq: Once | INTRAVENOUS | Status: DC | PRN

## 2021-08-22 MED ORDER — VASOPRESSIN 20 UNITS/100 ML INFUSION FOR SHOCK
0.0000 [IU]/min | INTRAVENOUS | Status: DC
  Administered 2021-08-22 – 2021-08-23 (×3): 0.03 [IU]/min via INTRAVENOUS
  Filled 2021-08-22 (×2): qty 100

## 2021-08-22 MED ORDER — HEPARIN (PORCINE) 25000 UT/250ML-% IV SOLN
1250.0000 [IU]/h | INTRAVENOUS | Status: DC
  Administered 2021-08-22 (×2): 1200 [IU]/h via INTRAVENOUS
  Administered 2021-08-23: 1100 [IU]/h via INTRAVENOUS
  Filled 2021-08-22 (×2): qty 250

## 2021-08-22 MED ORDER — PHENYLEPHRINE HCL-NACL 20-0.9 MG/250ML-% IV SOLN
INTRAVENOUS | Status: DC | PRN
  Administered 2021-08-22: 30 ug/min via INTRAVENOUS

## 2021-08-22 MED ORDER — NOREPINEPHRINE 4 MG/250ML-% IV SOLN
0.0000 ug/min | INTRAVENOUS | Status: DC
  Administered 2021-08-22: 8 ug/min via INTRAVENOUS
  Filled 2021-08-22: qty 250
  Filled 2021-08-22: qty 500

## 2021-08-22 MED ORDER — AMIODARONE HCL IN DEXTROSE 360-4.14 MG/200ML-% IV SOLN
60.0000 mg/h | INTRAVENOUS | Status: AC
  Administered 2021-08-22 – 2021-08-23 (×2): 60 mg/h via INTRAVENOUS
  Filled 2021-08-22: qty 400
  Filled 2021-08-22: qty 200

## 2021-08-22 MED ORDER — VASOPRESSIN 20 UNITS/100 ML INFUSION FOR SHOCK
INTRAVENOUS | Status: AC
  Administered 2021-08-22: 0.03 [IU]/min via INTRAVENOUS
  Filled 2021-08-22: qty 100

## 2021-08-22 MED ORDER — HEPARIN SODIUM (PORCINE) 1000 UNIT/ML IJ SOLN
INTRAMUSCULAR | Status: DC | PRN
  Administered 2021-08-22: 6000 [IU] via INTRAVENOUS

## 2021-08-22 MED ORDER — DILTIAZEM LOAD VIA INFUSION
20.0000 mg | Freq: Once | INTRAVENOUS | Status: AC
  Administered 2021-08-22: 20 mg via INTRAVENOUS
  Filled 2021-08-22: qty 20

## 2021-08-22 MED ORDER — VASOPRESSIN 20 UNIT/ML IV SOLN
INTRAVENOUS | Status: DC | PRN
  Administered 2021-08-22 (×2): 2 [IU] via INTRAVENOUS
  Administered 2021-08-22: 1 [IU] via INTRAVENOUS

## 2021-08-22 MED ORDER — HEPARIN 6000 UNIT IRRIGATION SOLUTION
Status: AC
  Filled 2021-08-22: qty 500

## 2021-08-22 MED ORDER — POLYETHYLENE GLYCOL 3350 17 G PO PACK
17.0000 g | PACK | Freq: Every day | ORAL | Status: DC
  Administered 2021-08-23: 17 g
  Filled 2021-08-22: qty 1

## 2021-08-22 MED ORDER — FENTANYL BOLUS VIA INFUSION
25.0000 ug | INTRAVENOUS | Status: DC | PRN
  Administered 2021-08-23: 50 ug via INTRAVENOUS

## 2021-08-22 MED ORDER — EPHEDRINE 5 MG/ML INJ
INTRAVENOUS | Status: AC
  Filled 2021-08-22: qty 10

## 2021-08-22 MED ORDER — PANTOPRAZOLE 2 MG/ML SUSPENSION
40.0000 mg | Freq: Every day | ORAL | Status: DC
  Administered 2021-08-23: 40 mg
  Filled 2021-08-22: qty 20

## 2021-08-22 MED ORDER — LACTATED RINGERS IV SOLN: INTRAVENOUS | Status: DC | PRN

## 2021-08-22 MED ORDER — HEPARIN BOLUS VIA INFUSION
4000.0000 [IU] | Freq: Once | INTRAVENOUS | Status: AC
Start: 2021-08-22 — End: 2021-08-22
  Administered 2021-08-22: 4000 [IU] via INTRAVENOUS
  Filled 2021-08-22: qty 4000

## 2021-08-22 MED ORDER — SUCCINYLCHOLINE CHLORIDE 200 MG/10ML IV SOSY
PREFILLED_SYRINGE | INTRAVENOUS | Status: AC
  Filled 2021-08-22: qty 10

## 2021-08-22 MED ORDER — VASOPRESSIN 20 UNIT/ML IV SOLN
INTRAVENOUS | Status: AC
  Filled 2021-08-22: qty 1

## 2021-08-22 MED ORDER — ROCURONIUM BROMIDE 10 MG/ML (PF) SYRINGE
PREFILLED_SYRINGE | INTRAVENOUS | Status: AC
  Filled 2021-08-22: qty 40

## 2021-08-22 MED ORDER — PHENYLEPHRINE HCL (PRESSORS) 10 MG/ML IV SOLN
INTRAVENOUS | Status: DC | PRN
  Administered 2021-08-22 (×2): 80 ug via INTRAVENOUS

## 2021-08-22 SURGICAL SUPPLY — 73 items
ADH SKN CLS APL DERMABOND .7 (GAUZE/BANDAGES/DRESSINGS) ×2
BAG COUNTER SPONGE SURGICOUNT (BAG) ×3 IMPLANT
BAG SPNG CNTER NS LX DISP (BAG) ×2
BANDAGE ESMARK 6X9 LF (GAUZE/BANDAGES/DRESSINGS) IMPLANT
BNDG CMPR 9X6 STRL LF SNTH (GAUZE/BANDAGES/DRESSINGS)
BNDG ELASTIC 4X5.8 VLCR STR LF (GAUZE/BANDAGES/DRESSINGS) IMPLANT
BNDG ESMARK 6X9 LF (GAUZE/BANDAGES/DRESSINGS)
CANISTER SUCT 3000ML PPV (MISCELLANEOUS) ×3 IMPLANT
CANISTER WOUND CARE 500ML ATS (WOUND CARE) ×2 IMPLANT
CANNULA VESSEL 3MM 2 BLNT TIP (CANNULA) IMPLANT
CATH EMB 3FR 40CM (CATHETERS) ×1 IMPLANT
CATH EMB 3FR 80CM (CATHETERS) IMPLANT
CATH EMB 4FR 40CM (CATHETERS) ×1 IMPLANT
CATH EMB 4FR 80CM (CATHETERS) IMPLANT
CATH EMB 5FR 80CM (CATHETERS) IMPLANT
CLIP LIGATING EXTRA MED SLVR (CLIP) ×3 IMPLANT
CLIP LIGATING EXTRA SM BLUE (MISCELLANEOUS) ×3 IMPLANT
CNTNR URN SCR LID CUP LEK RST (MISCELLANEOUS) IMPLANT
CONT SPEC 4OZ STRL OR WHT (MISCELLANEOUS) ×3
COVER PROBE W GEL 5X96 (DRAPES) ×1 IMPLANT
CUFF TOURN SGL QUICK 24 (TOURNIQUET CUFF)
CUFF TOURN SGL QUICK 34 (TOURNIQUET CUFF)
CUFF TOURN SGL QUICK 42 (TOURNIQUET CUFF) IMPLANT
CUFF TRNQT CYL 24X4X16.5-23 (TOURNIQUET CUFF) IMPLANT
CUFF TRNQT CYL 34X4.125X (TOURNIQUET CUFF) IMPLANT
DERMABOND ADVANCED (GAUZE/BANDAGES/DRESSINGS) ×1
DERMABOND ADVANCED .7 DNX12 (GAUZE/BANDAGES/DRESSINGS) ×2 IMPLANT
DRAIN CHANNEL 15F RND FF W/TCR (WOUND CARE) IMPLANT
DRAPE HALF SHEET 40X57 (DRAPES) IMPLANT
DRAPE X-RAY CASS 24X20 (DRAPES) IMPLANT
DRSG COVADERM 4X6 (GAUZE/BANDAGES/DRESSINGS) ×1 IMPLANT
DRSG VAC ATS LRG SENSATRAC (GAUZE/BANDAGES/DRESSINGS) ×2 IMPLANT
ELECT REM PT RETURN 9FT ADLT (ELECTROSURGICAL) ×3
ELECTRODE REM PT RTRN 9FT ADLT (ELECTROSURGICAL) ×2 IMPLANT
EVACUATOR SILICONE 100CC (DRAIN) IMPLANT
GAUZE 4X4 16PLY ~~LOC~~+RFID DBL (SPONGE) ×1 IMPLANT
GLOVE BIO SURGEON STRL SZ7.5 (GLOVE) ×3 IMPLANT
GOWN STRL REUS W/ TWL LRG LVL3 (GOWN DISPOSABLE) ×4 IMPLANT
GOWN STRL REUS W/ TWL XL LVL3 (GOWN DISPOSABLE) ×2 IMPLANT
GOWN STRL REUS W/TWL LRG LVL3 (GOWN DISPOSABLE) ×6
GOWN STRL REUS W/TWL XL LVL3 (GOWN DISPOSABLE) ×3
INSERT FOGARTY SM (MISCELLANEOUS) IMPLANT
KIT BASIN OR (CUSTOM PROCEDURE TRAY) ×3 IMPLANT
KIT TURNOVER KIT B (KITS) ×3 IMPLANT
MARKER GRAFT CORONARY BYPASS (MISCELLANEOUS) IMPLANT
NS IRRIG 1000ML POUR BTL (IV SOLUTION) ×6 IMPLANT
PACK PERIPHERAL VASCULAR (CUSTOM PROCEDURE TRAY) ×3 IMPLANT
PAD ARMBOARD 7.5X6 YLW CONV (MISCELLANEOUS) ×6 IMPLANT
SET COLLECT BLD 21X3/4 12 (NEEDLE) IMPLANT
SPONGE T-LAP 18X18 ~~LOC~~+RFID (SPONGE) ×1 IMPLANT
STAPLER VISISTAT 35W (STAPLE) ×1 IMPLANT
STOPCOCK 4 WAY LG BORE MALE ST (IV SETS) ×1 IMPLANT
SUT ETHILON 3 0 PS 1 (SUTURE) IMPLANT
SUT GORETEX 6.0 TT13 (SUTURE) IMPLANT
SUT GORETEX 6.0 TT9 (SUTURE) IMPLANT
SUT MNCRL AB 4-0 PS2 18 (SUTURE) ×7 IMPLANT
SUT PROLENE 5 0 C 1 24 (SUTURE) ×4 IMPLANT
SUT PROLENE 6 0 BV (SUTURE) ×4 IMPLANT
SUT PROLENE 7 0 BV 1 (SUTURE) IMPLANT
SUT SILK 2 0 SH (SUTURE) ×3 IMPLANT
SUT SILK 3 0 (SUTURE)
SUT SILK 3-0 18XBRD TIE 12 (SUTURE) IMPLANT
SUT VIC AB 2-0 CT1 27 (SUTURE) ×6
SUT VIC AB 2-0 CT1 TAPERPNT 27 (SUTURE) ×4 IMPLANT
SUT VIC AB 3-0 SH 27 (SUTURE) ×9
SUT VIC AB 3-0 SH 27X BRD (SUTURE) ×4 IMPLANT
SYR 3ML LL SCALE MARK (SYRINGE) ×1 IMPLANT
TAPE UMBILICAL COTTON 1/8X30 (MISCELLANEOUS) IMPLANT
TOWEL GREEN STERILE (TOWEL DISPOSABLE) ×3 IMPLANT
TRAY FOLEY MTR SLVR 16FR STAT (SET/KITS/TRAYS/PACK) IMPLANT
TUBING EXTENTION W/L.L. (IV SETS) IMPLANT
UNDERPAD 30X36 HEAVY ABSORB (UNDERPADS AND DIAPERS) ×3 IMPLANT
WATER STERILE IRR 1000ML POUR (IV SOLUTION) ×3 IMPLANT

## 2021-08-22 NOTE — Telephone Encounter (Signed)
Pt's daughter(who was informed by phone rep she is not on DPR) she called to report pt having hallucinations in seeing people who are not in the home. Daughter stated pt is taking sleeping pills causing pt to sleep 17 hours, and pt is driving.  The results of not being on DPR were explained to daughter, however she stated things are to a point that pt can no longer live alone.  Daughter explained something needs to be done.  Daughter was told a message would be sent to RN on provider.

## 2021-08-22 NOTE — Progress Notes (Signed)
[facility] Progress Note Patient Name: Jerome Pham. DOB: April 01, 1940 MRN: [id number]   Date of Service  08/21/2021  HPI/Events of Note  Patient with abrupt drop in saturation into the 80's and asymmetric breaths on auscultation, PO2 is 49 on ABG, and a stat portable CXR shows that the right lung is down.  eICU Interventions  PCCM ground crew requested to come to patient's room stat, and are on their way.        Frederik Pear 08/02/2021, 11:37 PM

## 2021-08-22 NOTE — Anesthesia Preprocedure Evaluation (Addendum)
Anesthesia Evaluation  Patient identified by MRN, date of birth, ID band Patient unresponsive    Reviewed: Patient's Chart, lab work & pertinent test results, Unable to perform ROS - Chart review only  Airway Mallampati: III  TM Distance: <3 FB Neck ROM: Full    Dental no notable dental hx.    Pulmonary neg pulmonary ROS, former smoker,    Pulmonary exam normal breath sounds clear to auscultation       Cardiovascular hypertension, Pt. on medications + Peripheral Vascular Disease  + dysrhythmias Atrial Fibrillation  Rhythm:Irregular Rate:Tachycardia  New onset afib with rvr   Neuro/Psych parkinsons dz  Neuromuscular disease negative psych ROS   GI/Hepatic Neg liver ROS, GERD  ,  Endo/Other  negative endocrine ROS  Renal/GU negative Renal ROS  negative genitourinary   Musculoskeletal negative musculoskeletal ROS (+)   Abdominal   Peds negative pediatric ROS (+)  Hematology negative hematology ROS (+)   Anesthesia Other Findings   Reproductive/Obstetrics negative OB ROS                            Anesthesia Physical Anesthesia Plan  ASA: 4 and emergent  Anesthesia Plan: General   Post-op Pain Management: Ofirmev IV (intra-op)*   Induction: Intravenous and Rapid sequence  PONV Risk Score and Plan: 2 and Ondansetron, Treatment may vary due to age or medical condition and Dexamethasone  Airway Management Planned: Oral ETT  Additional Equipment: Arterial line  Intra-op Plan:   Post-operative Plan: Possible Post-op intubation/ventilation  Informed Consent: I have reviewed the patients History and Physical, chart, labs and discussed the procedure including the risks, benefits and alternatives for the proposed anesthesia with the patient or authorized representative who has indicated his/her understanding and acceptance.     Dental advisory given  Plan Discussed with: CRNA and  Surgeon  Anesthesia Plan Comments: (Etomidate for induction)      Anesthesia Quick Evaluation

## 2021-08-22 NOTE — Anesthesia Procedure Notes (Signed)
Procedure Name: Intubation Date/Time: 08/06/2021 7:31 PM Performed by: Clovis Cao, CRNA Pre-anesthesia Checklist: Patient identified, Emergency Drugs available, Patient being monitored, Timeout performed and Suction available Patient Re-evaluated:Patient Re-evaluated prior to induction Oxygen Delivery Method: Circle system utilized Preoxygenation: Pre-oxygenation with 100% oxygen Induction Type: IV induction, Rapid sequence and Cricoid Pressure applied Laryngoscope Size: Miller and 2 Grade View: Grade I Tube type: Oral Tube size: 7.5 mm Number of attempts: 1 Airway Equipment and Method: Stylet Placement Confirmation: ETT inserted through vocal cords under direct vision, positive ETCO2 and breath sounds checked- equal and bilateral Secured at: 23 cm Tube secured with: Tape Dental Injury: Teeth and Oropharynx as per pre-operative assessment

## 2021-08-22 NOTE — ED Notes (Signed)
Critical result called from lab TROP 282. Pt just moved to short stay for OR. Notified Waunita Schooner, CRNA and [place]on Bridge PA of result

## 2021-08-22 NOTE — ED Provider Notes (Signed)
[facility] EMERGENCY DEPARTMENT Provider Note   CSN: [id number] Arrival date & time: 08/23/2021  1344     History  Chief Complaint  Patient presents with   Fall   Irregular Heart Beat   Weakness   Shortness of Breath   Leg Pain    Jerome Pham. is a 81 y.o. male.  Jerome Pham. is a 81 y.o. male with a history of Parkinson's, hypertension, GERD, who presents to the emergency department via EMS for evaluation after a fall.  Per EMS they were called by family after they found patient on the floor.  Patient reports that he thinks he fell sometime yesterday while it was still light out so patient has likely been on the ground for somewhere between 12 and 24 hours.  Patient reports he is not sure what caused him to fall but when he did he hit his head and thinks he lost consciousness briefly and then could not get up.  EMS said they saw signs that he was crawling around his home on the floor.  Family is in route to the hospital.  EMS noted the patient had a discolored right leg from the knee down with no detected pedal pulses and decreased sensation.  Patient reports he is unsure how long his foot has been discolored, reported that he thought it was "just muddy and in need of a washing since maybe Monday". Pt also complaining of some right shoulder pain, hx of prior shoulder replacement. He denies any chest pain or shortness of breath, EMS placed patient on 3 L nasal cannula due to tachypnea but no hypoxia noted on scene.  Patient denies abdominal pain.  Patient not on blood thinners.  Patient noted to be in A-fib RVR with EMS with no known history.  Family has not yet arrived to provide additional history.  The history is provided by the patient and the EMS personnel.      Home Medications Prior to Admission medications   Medication Sig Start Date End Date Taking? Authorizing Provider  atorvastatin (LIPITOR) 10 MG tablet Take 10 mg by mouth daily.    [provider]  carbidopa-levodopa (SINEMET IR) 25-100 MG tablet 1 pill 3 times a day. 03/13/21   Star Age, MD  hydrochlorothiazide (MICROZIDE) 12.5 MG capsule Take 12.5 mg by mouth daily. 09/21/16   [provider]  losartan (COZAAR) 50 MG tablet Take 50 mg by mouth daily. 11/10/16   [provider]  omeprazole (PRILOSEC) 10 MG capsule Take 20 mg by mouth daily.     [provider]      Allergies    Patient has no known allergies.    Review of Systems   Review of Systems  Physical Exam Updated Vital Signs BP 104/81 (BP Location: Left Arm)   Pulse (!) 157   Resp (!) 25   Wt 79.2 kg   SpO2 92%   BMI 25.05 kg/m  Physical Exam Vitals and nursing note reviewed.  Constitutional:      Appearance: Normal appearance. He is well-developed. He is ill-appearing. He is not diaphoretic.     Comments: Patient is alert but somewhat altered and ill-appearing  HENT:     Head: Normocephalic and atraumatic.     Mouth/Throat:     Mouth: Mucous membranes are dry.     Comments: Mucous membranes dry Eyes:     General:        Right eye: No discharge.  Left eye: No discharge.  Neck:     Comments: No palpable step-off or deformity Cardiovascular:     Rate and Rhythm: Tachycardia present. Rhythm irregular.     Pulses:          Dorsalis pedis pulses are 0 on the right side.       Posterior tibial pulses are 0 on the right side and detected w/ Doppler on the left side.     Heart sounds: Normal heart sounds.     Comments: Tachycardic to the 150s-160s with irregularly irregular rhythm  Left lower extremity with PT pulses detectable by Doppler, there is some edema and pulses are nonpalpable. Right lower extremity with dopplerable femoral pulse and faint popliteal pulse but no DP or PT pulses palpable or detectable by Doppler Pulmonary:     Comments: Patient is tachypneic, but able to speak in full sentences, no accessory muscle use, breath sounds slightly  diminished bilaterally but without rales, wheezes or rhonchi.  No chest wall tenderness or palpable deformity Abdominal:     General: Bowel sounds are normal. There is no distension.     Palpations: Abdomen is soft. There is no mass.     Tenderness: There is no abdominal tenderness. There is no guarding.     Comments: Abdomen soft, nondistended, nontender to palpation in all quadrants without guarding or peritoneal signs, no ecchymosis   Musculoskeletal:        General: Tenderness present.     Cervical back: Neck supple.     Comments: Right lower extremity mottled and cool to the touch from the knee down without palpable pulses, decreased sensation throughout the right lower leg.  Unable to palpate DP or PT pulses, faintly dopplerable popliteal pulse.  There is 2+ pitting edema throughout the right lower leg.  Left lower leg with similar edema but with normal color, warm to the touch, with dopplerable PT pulse.  Normal sensation Patient with tenderness over the right shoulder without obvious deformity, prior surgical scars noted. All other joints supple and easily movable, all compartments soft.  Skin:    General: Skin is warm and dry.     Capillary Refill: Capillary refill takes less than 2 seconds.  Neurological:     Mental Status: He is alert and oriented to person, place, and time.     Coordination: Coordination normal.     Comments: Patient very hard of hearing and so commands often need to be repeated but patient able to follow commands CN III-XII intact Normal strength in bilateral upper and lower extremities. Absent sensation in the right leg below the knee, sensation normal in other extremities. Some parkinsonian-like movements noted.  Psychiatric:        Mood and Affect: Mood normal.        Behavior: Behavior normal.    ED Results / Procedures / Treatments   Labs (all labs ordered are listed, but only abnormal results are displayed) Labs Reviewed  COMPREHENSIVE METABOLIC  PANEL  CBC WITH DIFFERENTIAL/PLATELET  LACTIC ACID, PLASMA  LACTIC ACID, PLASMA  PROTIME-INR  URINALYSIS, ROUTINE W REFLEX MICROSCOPIC  CK  APTT  I-STAT VENOUS BLOOD GAS, ED  I-STAT CHEM 8, ED  TROPONIN I (HIGH SENSITIVITY)  TROPONIN I (HIGH SENSITIVITY)    EKG EKG Interpretation  Date/Time:  Friday Aug 22 2021 13:55:48 EDT Ventricular Rate:  158 PR Interval:    QRS Duration: 81 QT Interval:  303 QTC Calculation: 492 R Axis:   265 Text Interpretation: Atrial fibrillation with  rapid V-rate Abnormal R-wave progression, late transition Inferior infarct, old Otherwise no significant change Confirmed by Deno Etienne [phone]) on 08/01/2021 1:57:08 PM  Radiology No results found.  Procedures .Critical Care Performed by: Jacqlyn Larsen, PA-C Authorized by: Jacqlyn Larsen, PA-C   Critical care provider statement:    Critical care time (minutes):  30   Critical care was necessary to treat or prevent imminent or life-threatening deterioration of the following conditions:  Circulatory failure   Critical care was time spent personally by me on the following activities:  Development of treatment plan with patient or surrogate, discussions with consultants, evaluation of patient's response to treatment, examination of patient, ordering and review of laboratory studies, ordering and review of radiographic studies, ordering and performing treatments and interventions, pulse oximetry, re-evaluation of patient's condition and review of old charts    Medications Ordered in ED Medications  sodium chloride 0.9 % bolus 1,000 mL (has no administration in time range)    ED Course/ Medical Decision Making/ A&P                           Medical Decision Making Amount and/or Complexity of Data Reviewed Labs: ordered. Radiology: ordered.   This patient presents to the ED for concern of A fib, AMS, fall, cold leg, this involves an extensive number of treatment options, and is a complaint that  carries with it a high risk of complications and morbidity.  The differential diagnosis includes ischemic limb, rhabdomyolysis, electrolyte derangement, head injury, intracranial bleeding, infection, embolic event from new onset A-fib Right lower extremity without palpable or dopplerable pulses with discoloration from the knee down, cold to the touch concerning for ischemic limb   Co morbidities that complicate the patient evaluation  Parkinson's, hypertension   Additional history obtained:  Additional history obtained from EMS personnel, family in route to the hospital External records from outside source obtained and reviewed including recent outpatient follow-up and prior labs   Lab Tests:  I ordered labs including i-STAT VBG and Chem-8, CBC, CMP, lactic acid, coags, troponin, CK.  Labs are pending at shift change  Imaging Studies ordered:  I ordered imaging studies including CT head and cervical spine, chest x-ray, pelvis and right shoulder x-rays I independently visualized and interpreted imaging which showed no pneumothorax, mild signs of CHF, no obvious pelvic fracture, right shoulder with prior replacement, no dislocation or fracture.  CT is pending at shift change I agree with the radiologist interpretation   Cardiac Monitoring: / EKG:  The patient was maintained on a cardiac monitor.  I personally viewed and interpreted the cardiac monitored which showed an underlying rhythm of: A-fib RVR with rates in the 150s   Consultations Obtained:  I requested consultation with the vascular surgeon,  and discussed lab and imaging findings as well as pertinent plan -Dr. Donzetta Matters recommends initiating heparin, and he will be down to see patient as soon as possible.  Does not recommend further imaging at this time. Will initiate heparin therapy as soon as head CT has resulted and does not show evidence of intracranial hemorrhage given head injury from fall.   Problem List / ED Course /  Critical interventions / Medication management  Patient is critically ill, arrives via EMS after fall, and has been down somewhere between 12 and 24 hours.  Has an ischemic limb clinically on arrival, patient also noted to be in new onset A-fib RVR.  Concerned that patient may  have thrown a clot from A-fib causing critical limb ischemia which may have resulted in patient's fall. Fluids ordered and will continue to closely monitor heart rate and blood pressure, may need rate controlling medications. Vascular surgery consulted and will see patient.  Heparin will be started as soon as patient has clear head CT given recent head injury. At shift change labs and imaging pending, patient will certainly require admission and is critically ill.  At shift change care signed out to [place] who will follow up on pending studies and vascular surgery recommendations.          Final Clinical Impression(s) / ED Diagnoses Final diagnoses:  Atrial fibrillation with RVR ([place])  Acute lower limb ischemia    Rx / DC Orders ED Discharge Orders     None         Janet Berlin 08/03/2021 [place], DO 08/23/21 [phone]

## 2021-08-22 NOTE — Consult Note (Incomplete)
   I have taken an interval history, reviewed the chart and examined the patient. I agree with the Advanced Practitioner's note, impression, and recommendations as outlined.   Jerome Pham is a 81 year old male with Parkinson's Found down for >12 hours with right ischemic lower extremity. Also in new afib.  S/p thrombectomy and fasciotomy Found down with Post-op on remains intubated. Started on neo and levo for persistent hypotension. Anesthesia placed central line.  The patient is critically ill with multiple organ systems failure and requires high complexity decision making for assessment and support, frequent evaluation and titration of therapies, application of advanced monitoring technologies and extensive interpretation of multiple databases.  Independent Critical Care Time: *** Minutes.

## 2021-08-22 NOTE — Progress Notes (Addendum)
ANTICOAGULATION CONSULT NOTE - Follow Up Consult  Pharmacy Consult for heparin Indication:  ischemic limb  Labs: Recent Labs    08/22/21 1536 08/22/21 1537 08/22/21 1614 08/22/21 1919 08/22/21 2210 08/22/21 2225 08/22/21 2301  HGB 16.0   < > 15.1   < > 14.3 14.6 16.0  HCT 47.0   < > 50.8   < > 42.0 45.2 47.0  PLT  --   --  199  --   --  223  --   APTT  --   --  157*  --   --   --   --   LABPROT  --   --  17.1*  --   --   --   --   INR  --   --  1.4*  --   --   --   --   HEPARINUNFRC  --   --   --   --   --  0.98*  --   CREATININE 1.20  --  1.33*  --   --  1.37*  --   CKTOTAL  --   --  2,834*  --   --   --   --   TROPONINIHS  --   --  282*  --   --   --   --    < > = values in this interval not displayed.    Assessment/Plan: 81yo male with supratherapeutic heparin level but pt had been in OR with pause in infusion with a 6000 unit bolus plus 6000 unit irrigation just a few hours before lab draw, which is likely the cause of the high level.  Infusion has resumed and will recheck level in am.  Vernard Gambles, PharmD, BCPS  08/22/2021,11:57 PM  Addendum: Heparin level remains high at 0.84 though could still have some bolus effect.  Will decrease heparin gtt slightly to 1100 units/hr and check level in 8 hours.  VB 08/23/2021 4:29 AM

## 2021-08-22 NOTE — Progress Notes (Signed)
ANTICOAGULATION CONSULT NOTE - Initial Consult  Pharmacy Consult for heparin Indication: Ischemic limb  No Known Allergies  Patient Measurements: Weight: 79.2 kg (174 lb 9.7 oz) Heparin Dosing Weight: TBW  Vital Signs: BP: 104/81 (05/26 1408) Pulse Rate: 157 (05/26 1408)  Labs: No results for input(s): HGB, HCT, PLT, APTT, LABPROT, INR, HEPARINUNFRC, HEPRLOWMOCWT, CREATININE, CKTOTAL, CKMB, TROPONINIHS in the last 72 hours.  CrCl cannot be calculated (Patient's most recent lab result is older than the maximum 21 days allowed.).   Medical History: Past Medical History:  Diagnosis Date   Aortic atherosclerosis ([place])    Barrett's esophagus with dysplasia    GERD (gastroesophageal reflux disease)    GERD (gastroesophageal reflux disease)    Hypertension    Vitamin D deficiency     Assessment: 80 YOM presenting after being found down 12-24h, discolored R-leg without pulses, pt states discoloration for several days prior.  He is not on anticoagulation PTA, head CT with no acute findings  Goal of Therapy:  Heparin level 0.3-0.7 units/ml Monitor platelets by anticoagulation protocol: Yes   Plan:  Heparin 4000 units IV x 1, and gtt at 1200 units/hr F/u 8 hour heparin level F/u any intervention plans  Bertis Ruddy, PharmD Clinical Pharmacist ED Pharmacist Phone # [phone] 08/27/2021 2:46 PM

## 2021-08-22 NOTE — Op Note (Signed)
Patient name: Jerome Pham. MRN: [id number] DOB: [birth year] Sex: male  08/02/2021 Pre-operative Diagnosis: acute right lower extremity ischemia Post-operative diagnosis:  Same Surgeon:  Erlene Quan C. Donzetta Matters, MD Assistant:.Samantha Claudean Kinds, [place] Procedure Performed: 1.  Right lower extremity thrombectomy via common femoral and below-knee popliteal approach 2.  Right lower extremity 4 compartment fasciotomies 3.  Application of wound VAC medial and lateral right leg  Indications: 81 year old male found down for a period of multiple hours with ischemic right lower extremity.  He is now indicated for right lower extremity revascularization and fasciotomy.  Findings: There is extensive appearing clot from the common femoral artery into the profunda down the entire SFA and below the knee.  There was a high takeoff of the anterior tibial artery and I was able to thrombectomize the anterior tibial as well as the posterior tibial arteries.  At completion there were anterior tibial and posterior tibial signals at the ankle.  The muscle was all pink but minimally reactive to cautery.  4 compartment fasciotomies were performed and wound vacs were placed.  Patient was hypotensive after revascularization of the right lower extremity requiring multiple pressors and will remain intubated with admission to the ICU.   Procedure:  The patient was identified in the holding area and taken to the operating room where is placed supine operative table general anesthesia was induced.  He was sterilely prepped and draped in the right lower extremity usual fashion, antibiotics were ministered a timeout was called.  I used ultrasound to identify the right common femoral artery as this was not palpable there did appear to be acute appearing thrombus there.  A vertical incision was made.  I dissected down to the common femoral artery where there was minimal pulsatility.  Additional heparin was administered.  We placed Vesseloops  around the common femoral as well as the profunda and the SFA.  Transverse arteriotomy was made where there was appearance of acute appearing thrombus.  We were able to evacuate all this thrombus there is very strong antegrade bleeding in the common femoral artery was clamped.  We passed a 4 Fogarty down the SFA and profunda and returned extensive thrombus.  Passes were made until there was 1 clean pass down both arteries.  These vessels were then clamped flushed with heparinized saline and the common femoral artery was closed with a running 5-0 Prolene suture.  Attention was turned to the below-knee popliteal exposure.  A longitudinal incision was made we dissected down through the subcutaneous tissue through the fascia.  Identified initially what I thought was a very small popliteal artery but then identified another artery which appeared to be a high takeoff of the anterior tibial.  We dissected free what appeared to finally be the posterior tibial and anterior tibial arteries and placed Vesseloops around these.  I started with the posterior tibial which may have been a very long tibioperoneal trunk although I never identified any branching to the peroneal artery.  A transverse arteriotomy was made and a 3 Fogarty was passed proximally and no thrombus was returned there was very strong antegrade bleeding.  I then made multiple passes down the posterior tibial with a 3 Fogarty until there was one clean pass had good backbleeding.  The artery was irrigated with heparinized saline closed with a running 6-0 Prolene suture.  Attention was turned to the anterior tibial artery where similarly a transverse arteriotomy was made and I passed a 3 Fogarty proximally there was no clot return  very strong antegrade bleeding.  I then made again multiple passes down the anterior tibial until clot was fully returned.  Again it was irrigated with heparinized saline and closed with a running 6-0 Prolene suture.  I now had very  strong signals within the wound bed and at the ankle.  I proceeded with extending the fasciotomy and the incision down to the level of the ankle.  I opened the deep fascia as well.  All muscle did appear pink and viable although was minimally reactive to cautery.  The saphenous vein was ligated.  I turned attention laterally made 1 longitudinal incision midway between the tibia and fibula that were palpable.  I first opened the fascia of the anterior compartment and then the lateral compartment and again there was bulging of the muscle particularly the anterior compartment and it was all pink and appeared viable but was minimally reactive to cautery.  We obtained hemostasis and thoroughly irrigated the wound and then fashion wound vacs in place.  Attention was turned back to the groin where we thoroughly irrigated closed in layers of Vicryl and staples at the skin level.  A sterile dressing was applied.  Patient was hypotensive after revascularization of the lower extremity he was also in atrial fibrillation with a rapid heart rate with plans to remain intubated and transferred to the ICU in critical condition.  All counts were correct at completion.  EBL: 250cc   Deauna Yaw C. Donzetta Matters, MD [facility] Office: [phone] Pager: [phone]

## 2021-08-22 NOTE — Anesthesia Procedure Notes (Signed)
Arterial Line Insertion Start/End05/28/2023 6:58 PM, 08/08/2021 5:03 PM Performed by: [name], MD, anesthesiologist  Patient location: OR. Preanesthetic checklist: patient identified, IV checked, site marked, risks and benefits discussed, surgical consent, monitors and equipment checked, pre-op evaluation, timeout performed and anesthesia consent Lidocaine 1% used for infiltration Left, radial was placed Catheter size: 20 G Hand hygiene performed  and maximum sterile barriers used   Attempts: 1 Procedure performed without using ultrasound guided technique. Following insertion, dressing applied and Biopatch. Post procedure assessment: normal and unchanged  Patient tolerated the procedure well with no immediate complications.

## 2021-08-22 NOTE — ED Triage Notes (Signed)
EMS reports that family called 911 after finding pt on the on the floor. Pt told ems that he fell while it was still light out yesterday. Rough est 12-24 hours on the floor. Pt noted with a discolored right leg from the knee down. No pedal pulse detected on right foot. C/o right shoulder pain. Pt also states that he doesn't feel anything on that lowe right leg.

## 2021-08-22 NOTE — Consult Note (Addendum)
Hospital Consult    Reason for Consult:  ischemic right leg Requesting Physician:  Marijean Bravo [facility] MRN #:  [id number]  History of Present Illness: This is a 81 y.o. male who presented to the hospital with an ischemic leg after fall.  The pt is not able to give me any information and is obtained from his son and the chart.    His son states that he tried to call his dad today and after a few tries of him not answering, he went to his house where he lives alone and found him down on the floor.  He states he was probably there for 12-15 hours.  He states that his dad has Parkinson's and over the past 3 weeks he has taken a downhill turn.  He does not have any knowledge of the pt having a hx of afib or any hx of cardiac disease.  He states he went to the doctor last week and his heart rate was elevated after walking in but after 5-10 minutes it was checked again and back down in the low 70's.    The pt is on a statin for cholesterol management.  The pt is not on a daily aspirin.   Other AC:  none The pt is on ARB, diuretic for hypertension.   The pt is not diabetic.   Tobacco hx:  former  Past Medical History:  Diagnosis Date   Aortic atherosclerosis ([place])    Barrett's esophagus with dysplasia    GERD (gastroesophageal reflux disease)    GERD (gastroesophageal reflux disease)    Hypertension    Vitamin D deficiency     Past Surgical History:  Procedure Laterality Date   CARPAL TUNNEL RELEASE     KNEE SURGERY Right    REVERSE SHOULDER ARTHROPLASTY Right 06/23/2019   Procedure: REVERSE SHOULDER ARTHROPLASTY;  Surgeon: Netta Cedars, MD;  Location: [facility];  Service: Orthopedics;  Laterality: Right;  interscalene block   SHOULDER SURGERY Left    TONSILLECTOMY      No Known Allergies  Prior to Admission medications   Medication Sig Start Date End Date Taking? Authorizing Provider  atorvastatin (LIPITOR) 10 MG tablet Take 10 mg by mouth daily.    [provider]   carbidopa-levodopa (SINEMET IR) 25-100 MG tablet 1 pill 3 times a day. 03/13/21   Star Age, MD  hydrochlorothiazide (MICROZIDE) 12.5 MG capsule Take 12.5 mg by mouth daily. 09/21/16   [provider]  losartan (COZAAR) 50 MG tablet Take 50 mg by mouth daily. 11/10/16   [provider]  omeprazole (PRILOSEC) 10 MG capsule Take 20 mg by mouth daily.     [provider]    Social History   Socioeconomic History   Marital status: Single    Spouse name: Not on file   Number of children: Not on file   Years of education: Not on file   Highest education level: Not on file  Occupational History   Not on file  Tobacco Use   Smoking status: Former   Smokeless tobacco: Never  Vaping Use   Vaping Use: Never used  Substance and Sexual Activity   Alcohol use: Yes    Alcohol/week: 21.0 standard drinks    Types: 7 Glasses of wine, 14 Cans of beer per week    Comment: nightly   Drug use: No   Sexual activity: Not on file  Other Topics Concern   Not on file  Social History Narrative   Not on  file   Social Determinants of Health   Financial Resource Strain: Not on file  Food Insecurity: Not on file  Transportation Needs: Not on file  Physical Activity: Not on file  Stress: Not on file  Social Connections: Not on file  Intimate Partner Violence: Not on file     Family History  Problem Relation Age of Onset   COPD Mother    Heart attack Father        his father died of heart attack at age of 19. All six of his father's brothers died of heart attack.   Parkinson's disease Neg Hx     ROS: '[x]'$  Positive   '[ ]'$  Negative   '[ ]'$  All sytems reviewed and are negative Unable to obtain from pt.  Please see HPI Cardiac: '[]'$  chest pain/pressure '[]'$  palpitations '[]'$  SOB lying flat '[]'$  DOE  Vascular: '[]'$  pain in legs while walking '[]'$  pain in legs at rest '[]'$  pain in legs at night '[]'$  non-healing ulcers '[]'$  hx of DVT '[]'$  swelling in legs  Pulmonary: '[]'$   asthma/wheezing '[]'$  home O2  Neurologic: '[]'$  hx of CVA '[]'$  mini stroke   Hematologic: '[]'$  hx of cancer  Endocrine:   '[]'$  diabetes '[]'$  thyroid disease  GI '[]'$  GERD  GU: '[]'$  CKD/renal failure '[]'$  HD--'[]'$  M/W/F or '[]'$  T/T/S  Psychiatric: '[]'$  anxiety '[]'$  depression  Musculoskeletal: '[]'$  arthritis '[]'$  joint pain  Integumentary: '[]'$  rashes '[]'$  ulcers  Constitutional: '[]'$  fever  '[]'$  chills  Physical Examination  Vitals:   08/10/2021 1408  BP: 104/81  Pulse: (!) 157  Resp: (!) 25  SpO2: 92%   Body mass index is 25.05 kg/m.  General:  WDWN in NAD Gait: Not observed HENT: WNL, normocephalic Pulmonary: normal non-labored breathing Cardiac: irregular Abdomen:  soft Skin: right leg mottled from just above the knee distally Vascular Exam/Pulses:  Right Left  Femoral 2+ (normal) 2+ (normal)  DP absent Brisk biphasic  PT absent    Extremities: RLE ischemic from the knee distally with delayed capillary refill;  he is able to wiggle toes on the left foot.  Motor or sensory not in tact right foot and calf     Musculoskeletal: no muscle wasting or atrophy  Neurologic: pt wakes enough to wiggle toes  CBC    Component Value Date/Time   WBC 6.2 06/16/2019 1140   RBC 4.49 06/16/2019 1140   HGB 12.7 (L) 06/24/2019 0340   HCT 37.8 (L) 06/24/2019 0340   PLT 301 06/16/2019 1140   MCV 98.4 06/16/2019 1140   MCH 32.3 06/16/2019 1140   MCHC 32.8 06/16/2019 1140   RDW 13.3 06/16/2019 1140   LYMPHSABS 0.6 (L) 05/06/2008 0850   MONOABS 1.0 05/06/2008 0850   EOSABS 0.0 05/06/2008 0850   BASOSABS 0.0 05/06/2008 0850    BMET    Component Value Date/Time   NA 131 (L) 06/24/2019 0340   K 3.2 (L) 06/24/2019 0340   CL 94 (L) 06/24/2019 0340   CO2 27 06/24/2019 0340   GLUCOSE 134 (H) 06/24/2019 0340   BUN 12 06/24/2019 0340   CREATININE 0.76 06/24/2019 0340   CALCIUM 8.2 (L) 06/24/2019 0340   GFRNONAA >60 06/24/2019 0340   GFRAA >60 06/24/2019 0340    COAGS: Lab Results   Component Value Date   INR 1.2 05/04/2008     Non-Invasive Vascular Imaging:   none   ASSESSMENT/PLAN: This is a 81 y.o. male with hx of parkinson's who was found down today for 12-15 hours  and now in the ER with ischemia of RLE.  Son states pt overall condition has declined over the past 3 weeks.      -pt has palpable femoral pulses bilaterally and his distal doppler signals are absent on the right.  Difficult to determine amount of time his right leg has been ischemic but sensory and motor are not in tact.  Pt found to be in afib in the ER, which certainly could be an etiology for his ischemia.   -may need thrombectomy with 4 compartment fasciotomy.  I discussed with family that once we restore blood flow to the right leg, the pt can become very ill.  He may be outside the window for thrombectomy and require amputation.  Dr. Donzetta Matters will be by to see pt and make further recommendations.     Leontine Locket, PA-C [facility] [phone]   I have independently interviewed and examined patient and agree with PA assessment and plan above.  Right lower extremity ischemia likely secondary to atrial fibrillation has been present for at least 12 hours possibly more.  I discussed with the patient and his son proceeding with thrombectomy of the right lower extremity with 4 compartment fasciotomies but have also discussed the high likelihood of above-knee amputation on the right and they demonstrate good understanding.  The son asked very pertinent questions which were all answered.  We will begin with thrombectomy in the OR today and if the muscle was nonviable and I cannot revascularize the lower extremity I would not proceed to bypass and instead would use for right above-knee amputation.  Neil Errickson C. Donzetta Matters, MD [facility] [place] Office: [phone] Pager: [phone]

## 2021-08-22 NOTE — ED Notes (Signed)
Report given to Dave, CRNA 

## 2021-08-22 NOTE — Transfer of Care (Signed)
Immediate Anesthesia Transfer of Care Note  Patient: Jerome Pham.  Procedure(s) Performed: RIGHT FEMORAL THROMBECTOMY, RIGHT LOWER EXTREMITY THROMBECTOMY (Right) RIGHT LOWER LEG FOUR COMPARTMENT FASCIOTOMY (Right: Leg Lower) APPLICATION OF WOUND VAC RIGHT LOWER LEG (Right: Leg Lower)  Patient Location: ICU  Anesthesia Type:General  Level of Consciousness: Patient remains intubated per anesthesia plan  Airway & Oxygen Therapy: Patient remains intubated per anesthesia plan and Patient placed on Ventilator (see vital sign flow sheet for setting)  Post-op Assessment: Report given to RN and Post -op Vital signs reviewed and stable  Post vital signs: Reviewed and stable  Last Vitals:  Vitals Value Taken Time  BP 154/77 08/09/2021 2120  Temp    Pulse 140 07/30/2021 2127  Resp 18 08/16/2021 2127  SpO2 82 % 08/26/2021 2127  Vitals shown include unvalidated device data.  Last Pain:  Vitals:   08/11/2021 1547  TempSrc: Rectal  PainSc:          Complications: No notable events documented.

## 2021-08-22 NOTE — Progress Notes (Signed)
Pharmacy Antibiotic Note  Jerome Pham. is a 81 y.o. male admitted on 07/29/2021 after fall with prolonged time on the floor before family found him, now with concern for sepsis.  Pharmacy has been consulted for vancomycin and cefepime dosing.  Plan: Vancomycin '1500mg'$  x1 then '1000mg'$  IV Q24H. Goal AUC 400-550.  Expected AUC 430. Cefepime 2g IV Q12H.  Height: '5\' 10"'$  (177.8 cm) Weight: 79.2 kg (174 lb 9.7 oz) IBW/kg (Calculated) : 73  Temp (24hrs), Avg:98.8 F (37.1 C), Min:98.8 F (37.1 C), Max:98.8 F (37.1 C)  Recent Labs  Lab 08/26/2021 1424 08/26/2021 1536 08/04/2021 1614 08/07/2021 2225  WBC  --   --  9.0 11.7*  CREATININE  --  1.20 1.33* 1.37*  LATICACIDVEN 3.2*  --  3.0*  --     Estimated Creatinine Clearance: 44.4 mL/min (A) (by C-G formula based on SCr of 1.37 mg/dL (H)).    No Known Allergies   Thank you for allowing pharmacy to be a part of this patient's care.  Wynona Neat, PharmD, BCPS  07/30/2021 11:54 PM

## 2021-08-22 NOTE — H&P (Signed)
NAME:  Jerome Pham., MRN:  [id number], DOB:  Jul 08, 1940, LOS: 0 ADMISSION DATE:  08/05/2021, CONSULTATION DATE:  5/26 REFERRING MD:  Donzetta Matters, CHIEF COMPLAINT:  ischemic leg after fall  History of Present Illness:  Patient is encephalopathic and/or intubated. Therefore history has been obtained from chart review.   Jerome Pham., is a 81 y.o. male, who presented to the [facility] ED with a chief complaint of ischemic leg after fall  They have a pertinent past medical history of parkinsons, GERD, atherosclerosis, HTN  Prior to arrival, family called 911 after patient being found down on floor.  Estimated 12 to 15 hours on floor.  Patient with no pedal pulse, stating did not feel there right lower leg.  Reports per family that patient in the past 3 weeks has taken a downhill turn.  Patient lives alone.  ED course was notable for patient being found in A-fib with RVR. The patient was started on a dilt gtt. WBC 9.0. Afebrile. CK 2834. No pulses in RLE. Taken emergently to OR with vascular.  PCCM was consulted for admission.  Pertinent  Medical History   parkinsons, GERD, atherosclerosis, HTN  Significant Hospital Events: Including procedures, antibiotic start and stop dates in addition to other pertinent events   5/26 Admit, OR with vascular.   Interim History / Subjective:  See above  Subjective: unable to obtain subjective exam due to patient status  Objective   Blood pressure (!) 154/77, pulse (!) 110, temperature 98.8 F (37.1 C), temperature source Rectal, resp. rate 20, height '5\' 10"'$  (1.778 m), weight 79.2 kg, SpO2 100 %.    Vent Mode: PRVC FiO2 (%):  [40 %] 40 % Set Rate:  [20 bmp] 20 bmp Vt Set:  [580 mL] 580 mL PEEP:  [5 cmH20] 5 cmH20 Plateau Pressure:  [13 cmH20] 13 cmH20   Intake/Output Summary (Last 24 hours) at 08/21/2021 2138 Last data filed at 08/10/2021 2053 Gross per 24 hour  Intake 2800 ml  Output 400 ml  Net 2400 ml   Filed Weights   08/08/2021 1410   Weight: 79.2 kg    Examination: General: In bed, NAD, appears comfortable HEENT: MM pink/moist, anicteric, atraumatic Neuro: chemically paralyzed post OR, PERRL 13m, sedated CV: S1S2, Afib, no m/r/g appreciated PULM:  clear in the upper lobes, clear in the lower lobes, trachea midline, chest expansion symmetric GI: soft, bsx4 active, non-tender   Extremities: RLE with faciotomy, DP pulse dopplerable, cool, all other extremities warm, dry, no significant edema Skin: generalized ecchymosis to legs and knees, no rashes or lesions noted  Lactic: 3.2>3.0 NA 152 K 3.4 ABG  VBG 7.42/41/33/27 AST 70 Bili 5.1 Albumin 3.4 Troponin 282 Inr 1.4 CK 2834 MG 1.8  Chest x-ray stable tubes, left lower lobe atelectasis CT head and C-spine: No acute intracranial findings, no acute cervical spine findings X-ray pelvis and shoulder: No acute abnormalities INR now 12 lead: Afib, no st changes noted  Resolved Hospital Problem list     Assessment & Plan:  Acute right lower extremity ischemia s/p 4 compartment fasciotomy with Dr. CDonzetta Matters suspect secondary to fall/prolonged down period Lactic acidosis, secondary to above -management per VVS -Continue ASA gtt -Wound vacs per vascular -IV fentanyl for pain control  Post operative respiratory failure- expected -LTVV strategy with tidal volumes of 4-8 cc/kg ideal body weight -Goal plateau pressures less than 30 and driving pressures less than 15 -Wean PEEP/FiO2 for SpO2 92-98% -VAP bundle -Daily SAT and SBT. Hope to  extubate in AM -PAD bundle with Propofol gtt and fentanyl push -RASS goal 0 to -1 -Follow up CXR and ABG  Afib with RVR patient No history of afib. ?secondary to demand -continue heparin gtt -switch from dilt to amio gtt with load -Goal K above 4, goal MG above 2 -Obtain echo  Troponin elevation Suspect demand -trend -continue on telemetry  Rhabdomyolysis CK 2834, 2500 of crystalloid given in OR -LR at 100 -Continue  foley -Recheck CK in am  Hypernatremia NA 152 -Recheck s/p fluid administration in OR  Transaminitis Elevated INR Bilirubin elevation Suspect secondary to shock downtime -Supportive care  Parkinsons dx -Continue sinimet  Best Practice (right click and "Reselect all SmartList Selections" daily)   Diet/type: NPO w/ meds via tube DVT prophylaxis: systemic heparin GI prophylaxis: PPI Lines: yes and it is still needed Foley:  Yes, and it is no longer needed Code Status:  full code Last date of multidisciplinary goals of care discussion [pending]  Labs   CBC: Recent Labs  Lab 08/13/2021 1536 08/25/2021 1537 08/26/2021 1614 08/10/2021 1919 08/06/2021 2032  WBC  --   --  9.0  --   --   NEUTROABS  --   --  7.3  --   --   HGB 16.0 15.6 15.1 15.6 15.0  HCT 47.0 46.0 50.8 46.0 44.0  MCV  --   --  102.4*  --   --   PLT  --   --  199  --   --     Basic Metabolic Panel: Recent Labs  Lab 08/19/2021 1536 08/16/2021 1537 08/02/2021 1614 07/30/2021 1919 08/03/2021 2032  NA 152* 152* 153* 151* 148*  K 3.4* 3.4* 3.5 3.8 5.1  CL 115*  --  114*  --   --   CO2  --   --  24  --   --   GLUCOSE 100*  --  105*  --   --   BUN 43*  --  44*  --   --   CREATININE 1.20  --  1.33*  --   --   CALCIUM  --   --  8.9  --   --   MG  --   --  1.8  --   --    GFR: Estimated Creatinine Clearance: 45.7 mL/min (A) (by C-G formula based on SCr of 1.33 mg/dL (H)). Recent Labs  Lab 08/13/2021 1424 08/15/2021 1614  WBC  --  9.0  LATICACIDVEN 3.2* 3.0*    Liver Function Tests: Recent Labs  Lab 08/10/2021 1614  AST 70*  ALT 11  ALKPHOS 43  BILITOT 5.1*  PROT 6.2*  ALBUMIN 3.4*   No results for input(s): LIPASE, AMYLASE in the last 168 hours. No results for input(s): AMMONIA in the last 168 hours.  ABG    Component Value Date/Time   PHART 7.327 (L) 08/23/2021 2032   PCO2ART 44.3 08/16/2021 2032   PO2ART 93 08/01/2021 2032   HCO3 23.2 08/13/2021 2032   TCO2 25 08/23/2021 2032   ACIDBASEDEF 3.0 (H)  08/05/2021 2032   O2SAT 97 08/26/2021 2032     Coagulation Profile: Recent Labs  Lab 07/31/2021 1614  INR 1.4*    Cardiac Enzymes: Recent Labs  Lab 08/10/2021 1614  CKTOTAL 2,834*    HbA1C: Hgb A1c MFr Bld  Date/Time Value Ref Range Status  12/04/2016 05:41 AM 5.2 4.8 - 5.6 % Final    Comment:    (NOTE) Pre diabetes:  5.7%-6.4% Diabetes:              >6.4% Glycemic control for   <7.0% adults with diabetes   05/04/2008 04:40 AM  4.6 - 6.1 % Final   5.5 (NOTE)   The ADA recommends the following therapeutic goal for glycemic   control related to Hgb A1C measurement:   Goal of Therapy:   < 7.0% Hgb A1C   Reference: American Diabetes Association: Clinical Practice   Recommendations 2008, Diabetes Care,  2008, 31:(Suppl 1).    CBG: Recent Labs  Lab 08/04/2021 2120  GLUCAP 122*    Review of Systems:   Unable to obtain review of systems due to patient status  Past Medical History:  He,  has a past medical history of Aortic atherosclerosis ([place]), Barrett's esophagus with dysplasia, GERD (gastroesophageal reflux disease), GERD (gastroesophageal reflux disease), Hypertension, and Vitamin D deficiency.   Surgical History:   Past Surgical History:  Procedure Laterality Date   CARPAL TUNNEL RELEASE     KNEE SURGERY Right    REVERSE SHOULDER ARTHROPLASTY Right 06/23/2019   Procedure: REVERSE SHOULDER ARTHROPLASTY;  Surgeon: Netta Cedars, MD;  Location: [facility];  Service: Orthopedics;  Laterality: Right;  interscalene block   SHOULDER SURGERY Left    TONSILLECTOMY       Social History:   reports that he has quit smoking. He has never used smokeless tobacco. He reports current alcohol use of about 21.0 standard drinks per week. He reports that he does not use drugs.   Family History:  His family history includes COPD in his mother; Heart attack in his father. There is no history of Parkinson's disease.   Allergies No Known Allergies   Home Medications  Prior to  Admission medications   Medication Sig Start Date End Date Taking? Authorizing Provider  atorvastatin (LIPITOR) 10 MG tablet Take 10 mg by mouth daily.   Yes [provider]  carbidopa-levodopa (SINEMET IR) 25-100 MG tablet 1 pill 3 times a day. Patient taking differently: Take 1 tablet by mouth 3 (three) times daily. 03/13/21  Yes Star Age, MD  hydrochlorothiazide (MICROZIDE) 12.5 MG capsule Take 12.5 mg by mouth daily. 09/21/16  Yes [provider]  losartan (COZAAR) 50 MG tablet Take 50 mg by mouth daily. 11/10/16  Yes [provider]  omeprazole (PRILOSEC) 10 MG capsule Take 20 mg by mouth daily.    Yes [provider]  Thiamine HCl (VITAMIN B-1 PO) Take 1 tablet by mouth daily.   Yes [provider]  VITAMIN D PO Take 1 tablet by mouth daily.   Yes [provider]     Critical care time: 45 minutes    Redmond School., MSN, APRN, AGACNP-BC [facility]  08/03/2021 , 9:38 PM  Please see [email or web address] for pager details  If no response, please call [phone] After hours, please call Elink at [phone]

## 2021-08-22 NOTE — Anesthesia Procedure Notes (Signed)
Central Venous Catheter Insertion Performed by: [name], MD, anesthesiologist Start/End05/08/2021 8:46 PM, 08/16/2021 8:56 PM Patient location: OR. Preanesthetic checklist: patient identified, IV checked, site marked, risks and benefits discussed, surgical consent, monitors and equipment checked, pre-op evaluation, timeout performed and anesthesia consent Position: Trendelenburg Lidocaine 1% used for infiltration and patient sedated Hand hygiene performed , maximum sterile barriers used  and Seldinger technique used Catheter size: 8 Fr Total catheter length 16. Central line was placed.Double lumen Procedure performed using ultrasound guided technique. Ultrasound Notes:anatomy identified, needle tip was noted to be adjacent to the nerve/plexus identified, no ultrasound evidence of intravascular and/or intraneural injection and image(s) printed for medical record Attempts: 1 Following insertion, line sutured, dressing applied and Biopatch. Post procedure assessment: blood return through all ports, free fluid flow and no air  Patient tolerated the procedure well with no immediate complications.

## 2021-08-22 NOTE — Anesthesia Postprocedure Evaluation (Signed)
Anesthesia Post Note  Patient: Jerome Pham.  Procedure(s) Performed: RIGHT FEMORAL THROMBECTOMY, RIGHT LOWER EXTREMITY THROMBECTOMY (Right) RIGHT LOWER LEG FOUR COMPARTMENT FASCIOTOMY (Right: Leg Lower) APPLICATION OF WOUND VAC RIGHT LOWER LEG (Right: Leg Lower)     Patient location during evaluation: SICU Anesthesia Type: General Level of consciousness: sedated Pain management: pain level controlled Vital Signs Assessment: post-procedure vital signs reviewed and stable Respiratory status: patient remains intubated per anesthesia plan Cardiovascular status: stable and tachycardic (Levophed infusion to maintain BP) Postop Assessment: no apparent nausea or vomiting Anesthetic complications: no   No notable events documented.  Last Vitals:  Vitals:   08/11/2021 1730 08/18/2021 2120  BP: 98/80 (!) 154/77  Pulse: (!) 120 (!) 110  Resp: (!) 30 20  Temp:    SpO2: 100% 100%    Last Pain:  Vitals:   08/23/2021 1547  TempSrc: Rectal  PainSc:                  Jerome Pham

## 2021-08-22 NOTE — ED Provider Notes (Signed)
Signout from [employer] at shift change. Briefly, patient presents for ischemic limb right lower extremity, new onset atrial fibrillation with RVR.  Please see previous note for details.  During my initial exam, family arrived.  They confirm that patient could have been on the ground for 12 to 16 hours.  They report history of Parkinson's disease with recent decompensation.  Patient lives alone.  No anticoagulation history.  Right lower extremity is cool and mottled, non-palpable pedal pulses.    Plan: Vascular surgery PA at bedside.  Patient will likely need surgery.  Currently in atrial fibrillation at rate 150-160 with borderline blood pressures.  Patient has received 1.5 L of fluid by EMS.  Awaiting lab work.   3:43 PM Reassessment performed. Patient appears stable.  Confused but answers basic questions.  Seems to recognize family members at bedside.  Labs and imaging personally reviewed and interpreted including: Venous blood gas with normal pH, hypernatremia 152, potassium 3.4.  I-STAT Chem-8 with creatinine 1.2 and BUN of 43. Head CT, cervical spine CT, x-ray of the chest, shoulder, pelvis.    Reviewed additional pertinent lab work and imaging with patient at bedside including:    Most current vital signs reviewed and are as follows: BP 104/81 (BP Location: Left Arm)   Pulse (!) 157   Resp (!) 25   Wt 79.2 kg   SpO2 92%   BMI 25.05 kg/m   Plan: Patient with ischemic limb, atrial fibrillation with rapid ventricular response, anticipate electrolyte derangement.    Imaging negative.  Patient will be started on heparin.  For atrial fibrillation, will go ahead and give 20 mg Cardizem and have push dose vasopressors (phenylephrine) available in case blood pressure becomes more tenuous.  Patient discussed with and seen by Dr. Gilford Raid.  ED ECG REPORT   Date: 08/19/2021  Rate: 158  Rhythm: atrial fibrillation  QRS Axis: left  Intervals: normal  ST/T Wave abnormalities: normal   Conduction Disutrbances:none  Narrative Interpretation:   Old EKG Reviewed: changes noted from 06/17/21 -- now in afib  I have personally reviewed the EKG tracing and agree with the computerized printout as noted.  3:51 PM Dr. Donzetta Matters at bedside.  Patient has received 20 mg Cardizem bolus, blood pressures 110/70 heart rate now 120s.  Family discussing options at bedside with vascular surgery.  Rectal temp normal. Low concern for sepsis at this point.   4:05 PM Consulted with Dr. Donzetta Matters directly regarding plan and work-up to this point.  They are going to take him to the operating room as soon as possible.  Will need ICU admission, but they will speak with PCCM after surgery.  Lactate reviewed and interpreted, 3.2, expected given ongoing limb ischemia.  Awaiting remainder of labs.  CRITICAL CARE Performed by: Carlisle Cater PA-C Total critical care time: 45 minutes Critical care time was exclusive of separately billable procedures and treating other patients. Critical care was necessary to treat or prevent imminent or life-threatening deterioration. Critical care was time spent personally by me on the following activities: development of treatment plan with patient and/or surrogate as well as nursing, discussions with consultants, evaluation of patient's response to treatment, examination of patient, obtaining history from patient or surrogate, ordering and performing treatments and interventions, ordering and review of laboratory studies, ordering and review of radiographic studies, pulse oximetry and re-evaluation of patient's condition.  4:49 PM checked on blood testing sent to main lab.  Lab states that they have blood and are running the labs now.  5:20 PM lab results reviewed including CBC with differential normal white blood cell count, normal hemoglobin; INR elevated at 1.4 in setting of heparin; repeat lactate 3.0 from 3.2.  Again currently, low concern for sepsis.  BP 127/85 (BP Location:  Left Arm)   Pulse (!) 128   Temp 98.8 F (37.1 C) (Rectal)   Resp (!) 28   Wt 79.2 kg   SpO2 100%   BMI 25.05 kg/m   5:36 PM Patient going to OR.         Carlisle Cater, PA-C 08/01/2021 1736    Isla Pence, MD 08/19/2021 [phone]

## 2021-08-23 ENCOUNTER — Inpatient Hospital Stay (HOSPITAL_COMMUNITY): Payer: Medicare HMO

## 2021-08-23 DIAGNOSIS — I998 Other disorder of circulatory system: Secondary | ICD-10-CM | POA: Diagnosis not present

## 2021-08-23 DIAGNOSIS — J95821 Acute postprocedural respiratory failure: Secondary | ICD-10-CM | POA: Diagnosis not present

## 2021-08-23 DIAGNOSIS — I4891 Unspecified atrial fibrillation: Secondary | ICD-10-CM

## 2021-08-23 LAB — POCT I-STAT 7, (LYTES, BLD GAS, ICA,H+H)
Acid-base deficit: 2 mmol/L (ref 0.0–2.0)
Acid-base deficit: 10 mmol/L — ABNORMAL HIGH (ref 0.0–2.0)
Acid-base deficit: 11 mmol/L — ABNORMAL HIGH (ref 0.0–2.0)
Acid-base deficit: 7 mmol/L — ABNORMAL HIGH (ref 0.0–2.0)
Bicarbonate: 24.4 mmol/L (ref 20.0–28.0)
Bicarbonate: 16.1 mmol/L — ABNORMAL LOW (ref 20.0–28.0)
Bicarbonate: 13.6 mmol/L — ABNORMAL LOW (ref 20.0–28.0)
Bicarbonate: 17.3 mmol/L — ABNORMAL LOW (ref 20.0–28.0)
Calcium, Ion: 0.87 mmol/L — CL (ref 1.15–1.40)
Calcium, Ion: 0.73 mmol/L — CL (ref 1.15–1.40)
Calcium, Ion: 0.91 mmol/L — ABNORMAL LOW (ref 1.15–1.40)
Calcium, Ion: 0.81 mmol/L — CL (ref 1.15–1.40)
HCT: 47 % (ref 39.0–52.0)
HCT: 45 % (ref 39.0–52.0)
HCT: 43 % (ref 39.0–52.0)
HCT: 36 % — ABNORMAL LOW (ref 39.0–52.0)
Hemoglobin: 16 g/dL (ref 13.0–17.0)
Hemoglobin: 15.3 g/dL (ref 13.0–17.0)
Hemoglobin: 14.6 g/dL (ref 13.0–17.0)
Hemoglobin: 12.2 g/dL — ABNORMAL LOW (ref 13.0–17.0)
O2 Saturation: 100 %
O2 Saturation: 97 %
O2 Saturation: 99 %
O2 Saturation: 95 %
Patient temperature: 98.2
Patient temperature: 97.1
Patient temperature: 35.3
Patient temperature: 36.4
Potassium: >8.5 mmol/L (ref 3.5–5.1)
Potassium: >8.5 mmol/L (ref 3.5–5.1)
Potassium: 7.1 mmol/L (ref 3.5–5.1)
Potassium: 4.9 mmol/L (ref 3.5–5.1)
Sodium: 138 mmol/L (ref 135–145)
Sodium: 135 mmol/L (ref 135–145)
Sodium: 142 mmol/L (ref 135–145)
Sodium: 148 mmol/L — ABNORMAL HIGH (ref 135–145)
TCO2: 26 mmol/L (ref 22–32)
TCO2: 17 mmol/L — ABNORMAL LOW (ref 22–32)
TCO2: 14 mmol/L — ABNORMAL LOW (ref 22–32)
TCO2: 18 mmol/L — ABNORMAL LOW (ref 22–32)
pCO2 arterial: 43.8 mmHg (ref 32–48)
pCO2 arterial: 34.8 mmHg (ref 32–48)
pCO2 arterial: 24.4 mmHg — ABNORMAL LOW (ref 32–48)
pCO2 arterial: 29 mmHg — ABNORMAL LOW (ref 32–48)
pH, Arterial: 7.352 (ref 7.35–7.45)
pH, Arterial: 7.27 — ABNORMAL LOW (ref 7.35–7.45)
pH, Arterial: 7.345 — ABNORMAL LOW (ref 7.35–7.45)
pH, Arterial: 7.38 (ref 7.35–7.45)
pO2, Arterial: 242 mmHg — ABNORMAL HIGH (ref 83–108)
pO2, Arterial: 101 mmHg (ref 83–108)
pO2, Arterial: 136 mmHg — ABNORMAL HIGH (ref 83–108)
pO2, Arterial: 75 mmHg — ABNORMAL LOW (ref 83–108)

## 2021-08-23 LAB — BASIC METABOLIC PANEL
Anion gap: 13 (ref 5–15)
Anion gap: 20 — ABNORMAL HIGH (ref 5–15)
Anion gap: 20 — ABNORMAL HIGH (ref 5–15)
Anion gap: 23 — ABNORMAL HIGH (ref 5–15)
BUN: 42 mg/dL — ABNORMAL HIGH (ref 8–23)
BUN: 42 mg/dL — ABNORMAL HIGH (ref 8–23)
BUN: 45 mg/dL — ABNORMAL HIGH (ref 8–23)
BUN: 43 mg/dL — ABNORMAL HIGH (ref 8–23)
CO2: 19 mmol/L — ABNORMAL LOW (ref 22–32)
CO2: 12 mmol/L — ABNORMAL LOW (ref 22–32)
CO2: 16 mmol/L — ABNORMAL LOW (ref 22–32)
CO2: 16 mmol/L — ABNORMAL LOW (ref 22–32)
Calcium: 8.4 mg/dL — ABNORMAL LOW (ref 8.9–10.3)
Calcium: 7.4 mg/dL — ABNORMAL LOW (ref 8.9–10.3)
Calcium: 7.2 mg/dL — ABNORMAL LOW (ref 8.9–10.3)
Calcium: 6.6 mg/dL — ABNORMAL LOW (ref 8.9–10.3)
Chloride: 114 mmol/L — ABNORMAL HIGH (ref 98–111)
Chloride: 116 mmol/L — ABNORMAL HIGH (ref 98–111)
Chloride: 111 mmol/L (ref 98–111)
Chloride: 112 mmol/L — ABNORMAL HIGH (ref 98–111)
Creatinine, Ser: 1.69 mg/dL — ABNORMAL HIGH (ref 0.61–1.24)
Creatinine, Ser: 2.04 mg/dL — ABNORMAL HIGH (ref 0.61–1.24)
Creatinine, Ser: 2.31 mg/dL — ABNORMAL HIGH (ref 0.61–1.24)
Creatinine, Ser: 2.67 mg/dL — ABNORMAL HIGH (ref 0.61–1.24)
GFR, Estimated: 41 mL/min — ABNORMAL LOW (ref >60)
GFR, Estimated: 32 mL/min — ABNORMAL LOW (ref >60)
GFR, Estimated: 28 mL/min — ABNORMAL LOW (ref >60)
GFR, Estimated: 23 mL/min — ABNORMAL LOW (ref >60)
Glucose, Bld: 192 mg/dL — ABNORMAL HIGH (ref 70–99)
Glucose, Bld: 96 mg/dL (ref 70–99)
Glucose, Bld: 114 mg/dL — ABNORMAL HIGH (ref 70–99)
Glucose, Bld: 78 mg/dL (ref 70–99)
Potassium: 4.7 mmol/L (ref 3.5–5.1)
Potassium: 6.3 mmol/L (ref 3.5–5.1)
Potassium: 6.6 mmol/L (ref 3.5–5.1)
Potassium: 5.7 mmol/L — ABNORMAL HIGH (ref 3.5–5.1)
Sodium: 146 mmol/L — ABNORMAL HIGH (ref 135–145)
Sodium: 148 mmol/L — ABNORMAL HIGH (ref 135–145)
Sodium: 147 mmol/L — ABNORMAL HIGH (ref 135–145)
Sodium: 151 mmol/L — ABNORMAL HIGH (ref 135–145)

## 2021-08-23 LAB — CBC
HCT: 48.5 % (ref 39.0–52.0)
HCT: 47.2 % (ref 39.0–52.0)
HCT: 49.5 % (ref 39.0–52.0)
Hemoglobin: 14.9 g/dL (ref 13.0–17.0)
Hemoglobin: 14.4 g/dL (ref 13.0–17.0)
Hemoglobin: 15.2 g/dL (ref 13.0–17.0)
MCH: 30.8 pg (ref 26.0–34.0)
MCH: 31.2 pg (ref 26.0–34.0)
MCH: 30.8 pg (ref 26.0–34.0)
MCHC: 30.7 g/dL (ref 30.0–36.0)
MCHC: 30.5 g/dL (ref 30.0–36.0)
MCHC: 30.7 g/dL (ref 30.0–36.0)
MCV: 100.2 fL — ABNORMAL HIGH (ref 80.0–100.0)
MCV: 102.4 fL — ABNORMAL HIGH (ref 80.0–100.0)
MCV: 100.4 fL — ABNORMAL HIGH (ref 80.0–100.0)
Platelets: 231 10*3/uL (ref 150–400)
Platelets: 194 10*3/uL (ref 150–400)
Platelets: 183 10*3/uL (ref 150–400)
RBC: 4.84 MIL/uL (ref 4.22–5.81)
RBC: 4.61 MIL/uL (ref 4.22–5.81)
RBC: 4.93 MIL/uL (ref 4.22–5.81)
RDW: 16.5 % — ABNORMAL HIGH (ref 11.5–15.5)
RDW: 16.6 % — ABNORMAL HIGH (ref 11.5–15.5)
RDW: 16.6 % — ABNORMAL HIGH (ref 11.5–15.5)
WBC: 14.7 10*3/uL — ABNORMAL HIGH (ref 4.0–10.5)
WBC: 12.8 10*3/uL — ABNORMAL HIGH (ref 4.0–10.5)
WBC: 15.1 10*3/uL — ABNORMAL HIGH (ref 4.0–10.5)
nRBC: 0.1 % (ref 0.0–0.2)
nRBC: 0.5 % — ABNORMAL HIGH (ref 0.0–0.2)
nRBC: 4.3 % — ABNORMAL HIGH (ref 0.0–0.2)

## 2021-08-23 LAB — HEPATIC FUNCTION PANEL
ALT: 309 U/L — ABNORMAL HIGH (ref 0–44)
AST: 1507 U/L — ABNORMAL HIGH (ref 15–41)
Albumin: 2.5 g/dL — ABNORMAL LOW (ref 3.5–5.0)
Alkaline Phosphatase: 32 U/L — ABNORMAL LOW (ref 38–126)
Bilirubin, Direct: 2.3 mg/dL — ABNORMAL HIGH (ref 0.0–0.2)
Indirect Bilirubin: 2.3 mg/dL — ABNORMAL HIGH (ref 0.3–0.9)
Total Bilirubin: 4.6 mg/dL — ABNORMAL HIGH (ref 0.3–1.2)
Total Protein: 4.4 g/dL — ABNORMAL LOW (ref 6.5–8.1)

## 2021-08-23 LAB — TRIGLYCERIDES: Triglycerides: 93 mg/dL (ref <150)

## 2021-08-23 LAB — LACTIC ACID, PLASMA
Lactic Acid, Venous: 4.9 mmol/L (ref 0.5–1.9)
Lactic Acid, Venous: >9 mmol/L (ref 0.5–1.9)
Lactic Acid, Venous: >9 mmol/L (ref 0.5–1.9)

## 2021-08-23 LAB — GLUCOSE, CAPILLARY
Glucose-Capillary: 115 mg/dL — ABNORMAL HIGH (ref 70–99)
Glucose-Capillary: 158 mg/dL — ABNORMAL HIGH (ref 70–99)

## 2021-08-23 LAB — ECHOCARDIOGRAM COMPLETE
Height: 70 in
Weight: 2793.67 oz

## 2021-08-23 LAB — HEPARIN LEVEL (UNFRACTIONATED)
Heparin Unfractionated: 0.84 IU/mL — ABNORMAL HIGH (ref 0.30–0.70)
Heparin Unfractionated: <0.1 IU/mL — ABNORMAL LOW (ref 0.30–0.70)

## 2021-08-23 LAB — CK: Total CK: 14053 U/L — ABNORMAL HIGH (ref 49–397)

## 2021-08-23 LAB — TROPONIN I (HIGH SENSITIVITY): Troponin I (High Sensitivity): 482 ng/L (ref <18)

## 2021-08-23 MED ORDER — DEXTROSE 5 % IV SOLN
INTRAVENOUS | Status: DC
  Filled 2021-08-23 (×2): qty 1000

## 2021-08-23 MED ORDER — NOREPINEPHRINE 16 MG/250ML-% IV SOLN
0.0000 ug/min | INTRAVENOUS | Status: DC
  Administered 2021-08-23: 80 ug/min via INTRAVENOUS
  Administered 2021-08-23: 22 ug/min via INTRAVENOUS
  Filled 2021-08-23 (×5): qty 250

## 2021-08-23 MED ORDER — GLYCOPYRROLATE 0.2 MG/ML IJ SOLN: 0.2000 mg | INTRAMUSCULAR | Status: DC | PRN

## 2021-08-23 MED ORDER — EPINEPHRINE HCL 5 MG/250ML IV SOLN IN NS
INTRAVENOUS | Status: AC
  Filled 2021-08-23: qty 250

## 2021-08-23 MED ORDER — SODIUM BICARBONATE 8.4 % IV SOLN
100.0000 meq | Freq: Once | INTRAVENOUS | Status: AC
  Administered 2021-08-23: 100 meq via INTRAVENOUS

## 2021-08-23 MED ORDER — ACETAMINOPHEN 325 MG PO TABS: 650.0000 mg | ORAL_TABLET | Freq: Four times a day (QID) | ORAL | Status: DC | PRN

## 2021-08-23 MED ORDER — MORPHINE BOLUS VIA INFUSION: 5.0000 mg | INTRAVENOUS | Status: DC | PRN

## 2021-08-23 MED ORDER — MORPHINE SULFATE (PF) 2 MG/ML IV SOLN: 2.0000 mg | INTRAVENOUS | Status: DC | PRN

## 2021-08-23 MED ORDER — ACETAMINOPHEN 650 MG RE SUPP: 650.0000 mg | Freq: Four times a day (QID) | RECTAL | Status: DC | PRN

## 2021-08-23 MED ORDER — SODIUM CHLORIDE 0.9 % IV BOLUS
500.0000 mL | Freq: Once | INTRAVENOUS | Status: AC
  Administered 2021-08-23: 500 mL via INTRAVENOUS

## 2021-08-23 MED ORDER — EPINEPHRINE HCL 5 MG/250ML IV SOLN IN NS
0.5000 ug/min | INTRAVENOUS | Status: DC
  Administered 2021-08-23: 2 ug/min via INTRAVENOUS
  Filled 2021-08-23: qty 250

## 2021-08-23 MED ORDER — SODIUM BICARBONATE 8.4 % IV SOLN
INTRAVENOUS | Status: AC
  Filled 2021-08-23: qty 100

## 2021-08-23 MED ORDER — POLYVINYL ALCOHOL 1.4 % OP SOLN: 1.0000 [drp] | Freq: Four times a day (QID) | OPHTHALMIC | Status: DC | PRN

## 2021-08-23 MED ORDER — SODIUM CHLORIDE 0.9 % IV SOLN
2.0000 g | Freq: Two times a day (BID) | INTRAVENOUS | Status: DC
  Administered 2021-08-23 (×2): 2 g via INTRAVENOUS
  Filled 2021-08-23 (×4): qty 12.5

## 2021-08-23 MED ORDER — SODIUM BICARBONATE 8.4 % IV SOLN
100.0000 meq | Freq: Once | INTRAVENOUS | Status: AC
  Filled 2021-08-23: qty 100

## 2021-08-23 MED ORDER — VANCOMYCIN HCL 1500 MG/300ML IV SOLN
1500.0000 mg | Freq: Once | INTRAVENOUS | Status: AC
  Administered 2021-08-23: 1500 mg via INTRAVENOUS
  Filled 2021-08-23: qty 300

## 2021-08-23 MED ORDER — ALBUMIN HUMAN 25 % IV SOLN
INTRAVENOUS | Status: AC
  Filled 2021-08-23: qty 50

## 2021-08-23 MED ORDER — VANCOMYCIN VARIABLE DOSE PER UNSTABLE RENAL FUNCTION (PHARMACIST DOSING): Status: DC

## 2021-08-23 MED ORDER — CHLORHEXIDINE GLUCONATE CLOTH 2 % EX PADS
6.0000 | MEDICATED_PAD | Freq: Every day | CUTANEOUS | Status: DC
  Administered 2021-08-23: 6 via TOPICAL

## 2021-08-23 MED ORDER — CHLORHEXIDINE GLUCONATE 0.12% ORAL RINSE (MEDLINE KIT)
15.0000 mL | Freq: Two times a day (BID) | OROMUCOSAL | Status: DC
  Administered 2021-08-23 (×2): 15 mL via OROMUCOSAL

## 2021-08-23 MED ORDER — LACTATED RINGERS IV BOLUS
1000.0000 mL | Freq: Once | INTRAVENOUS | Status: AC
Start: 2021-08-23 — End: 2021-08-23
  Administered 2021-08-23: 1000 mL via INTRAVENOUS

## 2021-08-23 MED ORDER — MIDAZOLAM HCL 2 MG/2ML IJ SOLN
2.0000 mg | INTRAMUSCULAR | Status: DC | PRN
  Administered 2021-08-23: 4 mg via INTRAVENOUS
  Filled 2021-08-23: qty 4

## 2021-08-23 MED ORDER — GLYCOPYRROLATE 0.2 MG/ML IJ SOLN
0.2000 mg | INTRAMUSCULAR | Status: DC | PRN
Start: 2021-08-23 — End: 2021-08-24
  Administered 2021-08-23: 0.2 mg via INTRAVENOUS
  Filled 2021-08-23: qty 1

## 2021-08-23 MED ORDER — CALCIUM GLUCONATE-NACL 1-0.675 GM/50ML-% IV SOLN
1.0000 g | Freq: Once | INTRAVENOUS | Status: AC
  Administered 2021-08-23: 1000 mg via INTRAVENOUS
  Filled 2021-08-23: qty 50

## 2021-08-23 MED ORDER — SODIUM ZIRCONIUM CYCLOSILICATE 10 G PO PACK
10.0000 g | PACK | Freq: Once | ORAL | Status: AC
  Administered 2021-08-23: 10 g
  Filled 2021-08-23: qty 1

## 2021-08-23 MED ORDER — VANCOMYCIN HCL IN DEXTROSE 1-5 GM/200ML-% IV SOLN: 1000.0000 mg | INTRAVENOUS | Status: DC

## 2021-08-23 MED ORDER — SODIUM BICARBONATE 8.4 % IV SOLN
100.0000 meq | Freq: Once | INTRAVENOUS | Status: AC
  Administered 2021-08-23: 100 meq via INTRAVENOUS
  Filled 2021-08-23: qty 50

## 2021-08-23 MED ORDER — SODIUM CHLORIDE 0.9 % IV SOLN
2.0000 g | Freq: Once | INTRAVENOUS | Status: AC
  Administered 2021-08-23: 2 g via INTRAVENOUS
  Filled 2021-08-23: qty 20

## 2021-08-23 MED ORDER — GLYCOPYRROLATE 1 MG PO TABS
1.0000 mg | ORAL_TABLET | ORAL | Status: DC | PRN
Start: 2021-08-23 — End: 2021-08-24

## 2021-08-23 MED ORDER — MORPHINE 100MG IN NS 100ML (1MG/ML) PREMIX INFUSION
0.0000 mg/h | INTRAVENOUS | Status: DC
  Administered 2021-08-23: 5 mg/h via INTRAVENOUS
  Filled 2021-08-23: qty 100

## 2021-08-23 MED ORDER — DIPHENHYDRAMINE HCL 50 MG/ML IJ SOLN: 25.0000 mg | INTRAMUSCULAR | Status: DC | PRN

## 2021-08-23 MED ORDER — ORAL CARE MOUTH RINSE
15.0000 mL | OROMUCOSAL | Status: DC
  Administered 2021-08-23 (×8): 15 mL via OROMUCOSAL

## 2021-08-23 MED ORDER — LACTATED RINGERS IV SOLN: INTRAVENOUS | Status: AC

## 2021-08-23 NOTE — Progress Notes (Addendum)
  Progress Note    08/23/2021 10:15 AM 1 Day Post-Op  Subjective: Intubated and sedated  Vitals:   08/23/21 0915 08/23/21 0923  BP:    Pulse: 86   Resp: 19   Temp: (!) 95.5 F (35.3 C)   SpO2: 91% (!) 74%    Physical Exam: Intubated and sedated Abdomen is soft Right groin incision healing well with staples Bloody incisions on the right with wound vacs to suction Monophasic dorsalis pedis and posterior tibial signals, both feet are cool to touch patient is notably cool there is no mottling below the knee at this time  CBC    Component Value Date/Time   WBC 14.7 (H) 08/23/2021 0230   RBC 4.84 08/23/2021 0230   HGB 14.6 08/23/2021 0912   HCT 43.0 08/23/2021 0912   PLT 231 08/23/2021 0230   MCV 100.2 (H) 08/23/2021 0230   MCH 30.8 08/23/2021 0230   MCHC 30.7 08/23/2021 0230   RDW 16.5 (H) 08/23/2021 0230   LYMPHSABS 0.8 08/10/2021 1614   MONOABS 0.8 08/17/2021 1614   EOSABS 0.0 08/12/2021 1614   BASOSABS 0.0 08/11/2021 1614    BMET    Component Value Date/Time   NA 142 08/23/2021 0912   K 7.1 (HH) 08/23/2021 0912   CL 114 (H) 08/23/2021 0230   CO2 19 (L) 08/23/2021 0230   GLUCOSE 192 (H) 08/23/2021 0230   BUN 42 (H) 08/23/2021 0230   CREATININE 1.69 (H) 08/23/2021 0230   CALCIUM 8.4 (L) 08/23/2021 0230   GFRNONAA 41 (L) 08/23/2021 0230   GFRAA >60 06/24/2019 0340    INR    Component Value Date/Time   INR 1.4 (H) 08/21/2021 1614     Intake/Output Summary (Last 24 hours) at 08/23/2021 1015 Last data filed at 08/23/2021 0600 Gross per 24 hour  Intake 5889.21 ml  Output 1040 ml  Net 4849.21 ml     Assessment/plan:  81 y.o. male is status post right lower extremity revascularization with 4 compartment fasciotomies.  Now with distributive shock likely secondary to reperfusion as well as aspiration.  Supportive care per critical care.  If he continues to worsen will possibly require above-knee amputation I discussed this with the family.   Rei Medlen C.  Donzetta Matters, MD [facility] Office: [phone] Pager: [phone]  08/23/2021 10:15 AM  Addendum:  I was called to the patient's bedside for continued decompensation despite maximal medical therapy.  Patient's revascularized lower extremity is certainly a primary factor in his decompensation and I have offered the family above-knee amputation but without knowing how much he would recover given his underlying pulmonary status and instability currently.  Discussion was held with the family in conjunction with critical care team and we have elected for DNR without any operative intervention.  I will continue to follow closely.  Charlyne Robertshaw C. Donzetta Matters, MD

## 2021-08-23 NOTE — Progress Notes (Signed)
Chaplain responded to EOL, request for family support.  Provided emotional and spiritual support, including prayer with son, Leroy Sea, and Bea Laura, and sister, Coralyn Helling, in [place], via speaker phone.  Third sibling, Thurmond Butts, was en route via flight from [place]. Son stated that pt is active member of [employer].  They accepted prayer as well as offer for priest to be called.  Chaplain contacted Father Warner Mccreedy, who came immediately to provide support.    Please call as needed for follow-up.  Minus Liberty, [place] Pager: [phone]

## 2021-08-23 NOTE — Progress Notes (Signed)
NAME:  Jerome Pham., MRN:  [id number], DOB:  04-17-1940, LOS: 1 ADMISSION DATE:  08/23/2021, CONSULTATION DATE:  5/26 REFERRING MD:  Donzetta Matters, CHIEF COMPLAINT:  ischemic leg after fall  History of Present Illness:  Patient is encephalopathic and/or intubated. Therefore history has been obtained from chart review.   Jerome Pham., is a 81 y.o. male, who presented to the [facility] ED with a chief complaint of ischemic leg after fall  They have a pertinent past medical history of parkinsons, GERD, atherosclerosis, HTN  Prior to arrival, family called 911 after patient being found down on floor.  Estimated 12 to 15 hours on floor.  Patient with no pedal pulse, stating did not feel there right lower leg.  Reports per family that patient in the past 3 weeks has taken a downhill turn.  Patient lives alone.  ED course was notable for patient being found in A-fib with RVR. The patient was started on a dilt gtt. WBC 9.0. Afebrile. CK 2834. No pulses in RLE. Taken emergently to OR with vascular.  PCCM was consulted for admission.  Pertinent  Medical History   parkinsons, GERD, atherosclerosis, HTN  Significant Hospital Events: Including procedures, antibiotic start and stop dates in addition to other pertinent events   5/26 Admit, OR with vascular.  5/27 therapeutic bronch  Interim History / Subjective:  Intubated, sedated, in afib Pressor needs are increasing  Objective   Blood pressure 90/62, pulse 93, temperature (!) 97.5 F (36.4 C), temperature source Oral, resp. rate 16, height '5\' 10"'$  (1.778 m), weight 79.2 kg, SpO2 93 %.    Vent Mode: PRVC FiO2 (%):  [40 %-100 %] 60 % Set Rate:  [16 bmp-20 bmp] 16 bmp Vt Set:  [580 mL] 580 mL PEEP:  [5 cmH20] 5 cmH20 Plateau Pressure:  [13 cmH20] 13 cmH20   Intake/Output Summary (Last 24 hours) at 08/23/2021 0757 Last data filed at 08/23/2021 0600 Gross per 24 hour  Intake 5889.21 ml  Output 1040 ml  Net 4849.21 ml    Filed Weights    08/21/2021 1410  Weight: 79.2 kg    Examination: Ill appearing man in bed sedated, intubated ETT with thick secretions Diminished breath sounds on R, not triggering vent Abdomen soft, hypoactive BS RLE fasciotomy sites with minimal sanguinous output LLE looks okay Winces to stimulation with some hand clenching  Acidemic on ABG mostly metabolic CBC stable Lactate up, CK up H/H stable  Resolved Hospital Problem list     Assessment & Plan:  Acute right lower extremity ischemia s/p 4 compartment fasciotomy with Dr. Donzetta Matters, suspect secondary to fall/prolonged down period -management per VVS -Continue heparin gtt -Wound vacs per vascular -IV fentanyl for pain control  Profound shock state- distributive related to ischemic limb and aspiration pneumonitis - Push crystalloid - Levophed/vasopressin titrated to MAP 65 - f/u echo  Post operative vent management R sided pneumonia question aspiration - Vent and VAP prevention bundle - Broad spectrum abx s/p   Afib with RVR patient, troponemia- Reactive -echo, already on heparin gtt - continue amiodarone  Rhabdomyolysis, lactic acidemia, AKI, shock liver - Push fluids, high risk for needing HD depending on GOC  Parkinsons dx -Continue sinimet  Best Practice (right click and "Reselect all SmartList Selections" daily)   Diet/type: NPO w/ meds via tube DVT prophylaxis: systemic heparin GI prophylaxis: PPI Lines: yes and it is still needed Foley:  Yes, and it is no longer needed Code Status:  full code Last date  of multidisciplinary goals of care discussion [will update family and discuss GOC]   Critical care time: 15 minutes   Erskine Emery MD PCCM

## 2021-08-23 NOTE — Progress Notes (Signed)
Progressive deterioration through day.  See Shirlee Limerick Bowser's IPAL note.  Family en route.  Erskine Emery MD PCCM

## 2021-08-23 NOTE — IPAL (Signed)
  Interdisciplinary Goals of Care Family Meeting   Date carried out: 08/23/2021  Location of the meeting: Bedside  Member's involved: Nurse Practitioner, Bedside Registered Nurse, and Family Member or next of kin  Durable Power of Attorney or acting medical decision maker: Pt son Leroy Sea, at bedside  Discussion: We discussed goals of care for [employer] Unfortunately the patient has progressively declined, now with severe shock requiring NE Epi vaso and ongoing multisystem organ dysfunction. Discussions were had about attempting a salvage BKA vs focusing on comfort as the end of life approaches. He appears to be declaring himself.  Family has requested DNR status. They have elected against BKA-- citing very appropriately concerns about his instability as well as concerns about quality of life s/p BKA should the pt survive current course-- but are not ready to transition to comfort care.  Pts third child is currently flying to [place] from [place] and is not able to be reached--pt other 2 children would like to include him in [place] discussion if possible but do wholly understand that the patient may die in interim.   Plan will be to continue current medical support, no operative intervention, and update code status to DNR. Revisit goals of care when traveling family member able to be reached.   Code status: Full DNR  Disposition: Continue current acute care  Time spent for the meeting: 15 minutes    Eliseo Gum MSN, AGACNP-BC [place] for pager 08/23/2021, 1:13 PM

## 2021-08-23 NOTE — Progress Notes (Signed)
ANTICOAGULATION CONSULT NOTE  Pharmacy Consult for IV heparin Indication:  ischemic limb  No Known Allergies  Patient Measurements: Height: '5\' 10"'$  (177.8 cm) Weight: 79.2 kg (174 lb 9.7 oz) IBW/kg (Calculated) : 73 Heparin Dosing Weight: 79.2 kg  Vital Signs: Temp: 100.8 F (38.2 C) (05/27 1500) BP: 137/91 (05/27 1430) Pulse Rate: 243 (05/27 1500)  Labs: Recent Labs    07/31/2021 1614 08/07/2021 1919 08/21/2021 2225 08/20/2021 2301 08/23/21 0230 08/23/21 0622 08/23/21 0912 08/23/21 1018 08/23/21 1312  HGB 15.1   < > 14.6   < > 14.9 15.3 14.6 14.4  --   HCT 50.8   < > 45.2   < > 48.5 45.0 43.0 47.2  --   PLT 199  --  223  --  231  --   --  194  --   APTT 157*  --   --   --   --   --   --   --   --   LABPROT 17.1*  --   --   --   --   --   --   --   --   INR 1.4*  --   --   --   --   --   --   --   --   HEPARINUNFRC  --   --  0.98*  --  0.84*  --   --   --  <0.10*  CREATININE 1.33*  --  1.37*  --  1.69*  --   --  2.04*  --   CKTOTAL 2,834*  --   --   --  14,053*  --   --   --   --   TROPONINIHS 282*  --   --   --  482*  --   --   --   --    < > = values in this interval not displayed.    Estimated Creatinine Clearance: 29.8 mL/min (A) (by C-G formula based on SCr of 2.04 mg/dL (H)).   Medical History: Past Medical History:  Diagnosis Date   Aortic atherosclerosis ([place])    Barrett's esophagus with dysplasia    GERD (gastroesophageal reflux disease)    GERD (gastroesophageal reflux disease)    Hypertension    Vitamin D deficiency     Medications:  Infusions:   amiodarone 30 mg/hr (08/23/21 1207)   ceFEPime (MAXIPIME) IV 2 g (08/23/21 1054)   epinephrine 2 mcg/min (08/23/21 1254)   fentaNYL infusion INTRAVENOUS 100 mcg/hr (08/23/21 0600)   heparin 1,100 Units/hr (08/23/21 0600)   norepinephrine (LEVOPHED) Adult infusion 50 mcg/min (08/23/21 1200)   sodium bicarbonate 150 mEq in D5W infusion 125 mL/hr at 08/23/21 1121   sodium chloride     vasopressin 0.03  Units/min (08/23/21 0600)    Assessment: 81 yo male admitted with rhabdomyolysis; ischemic limb, on IV heparin.  Heparin level initially high this AM, suspected related to previous heparin bolus doses.  This afternoon heparin level now undetectable.  No known issues with IV infusion.  No overt bleeding or complications noted.  Goal of Therapy:  Heparin level 0.3-0.7 units/ml Monitor platelets by anticoagulation protocol: Yes   Plan:  Increase IV heparin to 1250 units/hr. Repeat heparin level in 8 hrs. Daily heparin level and CBC. [place] Crane, Roylene Reason, BCCP Clinical Pharmacist  08/23/2021 3:17 PM   [facility] pharmacy phone numbers are listed on [email or web address]

## 2021-08-23 NOTE — Progress Notes (Deleted)
    08/23/21 1228  Clinical Encounter Type  Visited With Patient and family together  Visit Type Initial;Patient actively dying  Referral From Nurse  Consult/Referral To Chaplain  Spiritual Encounters  Spiritual Needs Prayer (priest)  Stress Factors  Patient Stress Factors Major life changes  Family Stress Factors Loss;Major life changes

## 2021-08-23 NOTE — Progress Notes (Signed)
[facility] Progress Note Patient Name: Jerome Pham. DOB: Jul 18, 1940 MRN: [id number]   Date of Service  08/23/2021  HPI/Events of Note  Asked to camera in for severe hypotension SBP in 60s Patient on Norepinephrine at 80 mic, Vaso at 0.03 mic and Epi at 12 mic. Was on Epi at 8 mic when the shift started. Has barely made any urine during the day. Per notes, family refused BKA and he has a DNR status. He is also on Fentanyl, amio and heparin drips. Most recent H/H was stable.   eICU Interventions  500 cc NS bolus x 1 2 amps bicarb Check BMP and ABG now  Vaso to 0.04 for shock since we are almost max on pressors anyway  DNR status noted D./w both sons who are in the room  Patient's condition continues to decline and they are aware of the plan. They did not have questions for me at this time. Very guarded prognosis      Intervention Category Major Interventions: Shock - evaluation and management;Respiratory failure - evaluation and management  [name] G Nidal Rivet 08/23/2021, 8:30 PM

## 2021-08-23 NOTE — Progress Notes (Signed)
[facility] Progress Note Patient Name: Jerome Pham. DOB: 1941/03/16 MRN: [id number]   Date of Service  08/23/2021  HPI/Events of Note  Patient with oliguria. He has rhabdomyolysis. Vasopressor requirement is increasing.  eICU Interventions  NS 500 ml iv fluid bolus ordered and iv fluid rate increased to 150 ml / hour.        Kerry Kass Zafiro Routson 08/23/2021, 4:54 AM

## 2021-08-23 NOTE — Procedures (Signed)
Extubation Procedure Note  Patient Details:   Name: Jerome Pham. DOB: January 05, 1941 MRN: [id number]   Airway Documentation:  Airway 7.5 mm (Active)  Secured at (cm) 28 cm 08/23/21 2000  Measured From Lips 08/23/21 2000  Secured Location Left 08/23/21 2000  Secured By [employer] 08/23/21 2000  Tube Holder Repositioned Yes 08/23/21 2000  Prone position No 08/23/21 2000  Cuff Pressure (cm H2O) Green OR 18-26 CmH2O 08/23/21 2000  Site Condition Dry 08/23/21 2000   Vent end date: 08/23/21 Vent end time: 11:45 PM  Evaluation  O2 sats: stable throughout Complications: No apparent complications Patient did tolerate procedure well. Bilateral Breath Sounds: Clear, Diminished   Pt compassionately extubated and placed on 2L [place] for comfort. Rt will cont to monitor.  [place] G Yuleni Burich 08/23/2021, 11:45 PM

## 2021-08-23 NOTE — Progress Notes (Signed)
   08/23/21 2120  Clinical Encounter Type  Visited With Patient and family together  Visit Type Initial  Referral From Nurse  Consult/Referral To Chaplain   Chaplain responded to a call for support from the nurse. The patient, Jerome Pham, is in decline and the family is grieving the sudden change in his health. I provided spiritual support in the form of prayer for [name] and for the family. I advised the family if they wanted a chaplain to return to let nurse know and we will return.  Danice Goltz [facility]  [phone]

## 2021-08-23 NOTE — IPAL (Signed)
  Interdisciplinary Goals of Care Family Meeting   Date carried out:: 08/23/2021  Location of the meeting: Bedside  Member's involved: Nurse Practitioner and Family Member or next of kin  [place] or acting medical decision maker:   Adult children Jerome Pham, Jerome Pham (phone), Jerome Pham  Discussion: We discussed goals of care for [employer]  We discussed how in the setting of escalating pressor requirements with continued hypotension Jerome Pham is likely in the process of dying.  Family having questions regarding transition to comfort focused care interventions.  Interventions explained.  Discussed removal of endotracheal tube for patient comfort.  I left the room for family discuss goals of care amongst themselves.  When called back to the room family informed me that they would like to make the patient as comfortable as possible with morphine for comfort and benzodiazepines for anxiety.  They would also like to remove the breathing tube to ensure patient comfort.   Comfort focused order set placed.  Medicines to be started and then patient extubated.  Nursing notified.  Active comfort care.  Code status: Full DNR  Disposition: In-patient comfort care   Time spent for the meeting: 25 minutes  Jerome Pham 08/23/2021, 10:42 PM

## 2021-08-23 NOTE — Procedures (Addendum)
Bronchoscopy Procedure Note  Kacper Cartlidge  [id number]  07/03/1940  Date:08/23/21  Time:12:03 AM   Provider Performing:Zyire Eidson Rodman Pickle, MD  Procedure(s):  Initial Therapeutic Aspiration of Tracheobronchial Tree [phone])  Indication(s) Atelectasis Acute hypoxemic respiratory failure  Consent Unable to obtain consent due to emergent nature of procedure.  Anesthesia Propofol gtt   Time Out Verified patient identification, verified procedure, site/side was marked, verified correct patient position, special equipment/implants available, medications/allergies/relevant history reviewed, required imaging and test results available.   Sterile Technique Usual hand hygiene, masks, gowns, and gloves were used   Procedure Description Bronchoscope advanced through endotracheal tube and into airway.  Airways were examined down to subsegmental level with findings noted below.   Following diagnostic evaluation, Therapeutic aspiration performed in RLL  Findings: Normal anatomy and mucosa. Copious thick white secretions in RLL. Aspirated with suction with visualization of subsegmental airways   Complications/Tolerance None; patient tolerated the procedure well. Hypoxemia in low 80s improved to mid 90s Chest X-ray is needed post procedure.   EBL Minimal   Specimen(s) None

## 2021-08-25 ENCOUNTER — Encounter (HOSPITAL_COMMUNITY): Payer: Self-pay | Admitting: Vascular Surgery

## 2021-08-26 LAB — SURGICAL PATHOLOGY

## 2021-08-26 NOTE — Telephone Encounter (Signed)
Upon review of chart, sadly patient was admitted to the hospital the same day and passed on 09/01/21. We will send a condolence card to the home.

## 2021-08-27 LAB — PATHOLOGIST SMEAR REVIEW

## 2021-08-28 LAB — CULTURE, BLOOD (ROUTINE X 2)
Culture: NO GROWTH
Culture: NO GROWTH
Special Requests: ADEQUATE
Special Requests: ADEQUATE

## 2021-08-28 NOTE — Progress Notes (Signed)
Hillery Aldo, NP discussed goals of care with patients family at bedside at 2241. Comfort Care orders were initiated. See MAR for medications and administration.   Patient extubated at 2345.   Order for 2 RN's to pronounce time of death verified. Time of death noted at [place] with absent pulse, respirations and BP. Confirmed by Lorrin Jackson, RN.   [place] notified before extubation and after time of Death.

## 2021-08-28 DEATH — deceased

## 2021-09-16 ENCOUNTER — Ambulatory Visit: Payer: Medicare HMO | Admitting: Adult Health

## 2021-09-27 NOTE — Death Summary Note (Signed)
DEATH SUMMARY   Patient Details  Name: Jerome Pham. MRN: [id number] DOB: 17-May-1940  Admission/Discharge Information   Admit Date:  05-Sep-2021  Date of Death: Date of Death: September 07, 2021  Time of Death: Time of Death: 06/19/22  Length of Stay: 2  Referring Physician: Kathyrn Lass, MD   Reason(s) for Hospitalization  Ischemic leg Distributive shock due to leg ischemia and aspiration pneumonitis Acute hypoxemic respiratory failure due to aspiration pneumonitis of right lung Acute renal failure due to rhabdomyolysis Afib with RVR Shock liver Failure to thrive POA Parkinson's disease Mild protein calorie malnutrition POA  Brief Hospital Course (including significant findings, care, treatment, and services provided and events leading to death)  Gayla Doss., is a 81 y.o. male, who presented to the [facility] ED with a chief complaint of right leg pain after fall   They have a pertinent past medical history of parkinsons, GERD, atherosclerosis, HTN   Prior to arrival, family called 911 after patient being found down on floor.  Estimated 12 to 15 hours on floor.  Patient with no pedal pulse, stating did not feel there right lower leg.  Reports per family that patient in the past 3 weeks has taken a downhill turn.  Patient lives alone.   ED course was notable for patient being found in A-fib with RVR. The patient was started on a dilt gtt. WBC 9.0. Afebrile. CK 2834. No pulses in RLE. Taken emergently to OR with vascular.  After multi-compartmental fasciotomies to attempt to preserve RLE (patient would not want BKA), patient went into progressive irreversible multiorgan failure with shock.  Due to patient's previously stated wishes, he transitioned to comfort care and passed peacefully with family at bedside.  Pertinent Labs and Studies  Significant Diagnostic Studies DG Shoulder Right  Result Date: 09-05-21 CLINICAL DATA:  [place], found down EXAM: RIGHT SHOULDER - 2+ VIEW  COMPARISON:  06/23/2019 FINDINGS: Frontal and transscapular views of the right shoulder are obtained. Right shoulder arthroplasty is again identified in grossly stable position. There is evidence of a prior healed periprosthetic proximal right humeral fracture. No acute fracture, subluxation, or dislocation. Stable hypertrophic changes of the acromioclavicular joint. No right-sided rib fractures. Intrathoracic findings consistent with mild congestive heart failure. IMPRESSION: 1. No acute bony abnormalities. 2. Prior healed periprosthetic proximal right humeral fracture. 3. Mild congestive heart failure. Electronically Signed   By: Randa Ngo M.D.   On: 09/05/2021 15:21   CT Head Wo Contrast  Result Date: 09-05-21 CLINICAL DATA:  Head and neck trauma. Patient found down on floor, estimated 12-24 hours before being found. EXAM: CT HEAD WITHOUT CONTRAST CT CERVICAL SPINE WITHOUT CONTRAST TECHNIQUE: Multidetector CT imaging of the head and cervical spine was performed following the standard protocol without intravenous contrast. Multiplanar CT image reconstructions of the cervical spine were also generated. RADIATION DOSE REDUCTION: This exam was performed according to the departmental dose-optimization program which includes automated exposure control, adjustment of the mA and/or kV according to patient size and/or use of iterative reconstruction technique. COMPARISON:  MRI brain 06/03/2017 FINDINGS: CT HEAD FINDINGS Brain: The brainstem, cerebellum, cerebral peduncles, thalami, basal ganglia, basilar cisterns, and ventricular system appear within normal limits. No intracranial hemorrhage, mass lesion, or acute CVA. Vascular: Unremarkable Skull: Unremarkable Sinuses/Orbits: Mild chronic bilateral maxillary, ethmoid, and sphenoid sinusitis. Other: No supplemental non-categorized findings. CT CERVICAL SPINE FINDINGS Alignment: Mild straightening of the normal cervical lordosis. 1.5 mm degenerative  anterolisthesis at C7-T1. Skull base and vertebrae: Spurring and loss of  articular space at the anterior C1-2 articulation with superior spurring from the anterior arch of C1 abutting the basion. No fracture or acute bony findings identified. Soft tissues and spinal canal: Unremarkable Disc levels: Uncinate and facet spurring causes osseous foraminal stenosis bilaterally at C4-5, C5-6, and C6-7; and on the left at C3-4. Upper chest: Unremarkable Other: No supplemental non-categorized findings. IMPRESSION: 1. No acute intracranial findings or acute cervical spine findings. 2. Mild chronic paranasal sinusitis. 3. Multilevel cervical impingement due to spurring. Electronically Signed   By: Van Clines M.D.   On: 08/09/2021 15:34   CT Cervical Spine Wo Contrast  Result Date: 07/29/2021 CLINICAL DATA:  Head and neck trauma. Patient found down on floor, estimated 12-24 hours before being found. EXAM: CT HEAD WITHOUT CONTRAST CT CERVICAL SPINE WITHOUT CONTRAST TECHNIQUE: Multidetector CT imaging of the head and cervical spine was performed following the standard protocol without intravenous contrast. Multiplanar CT image reconstructions of the cervical spine were also generated. RADIATION DOSE REDUCTION: This exam was performed according to the departmental dose-optimization program which includes automated exposure control, adjustment of the mA and/or kV according to patient size and/or use of iterative reconstruction technique. COMPARISON:  MRI brain 06/03/2017 FINDINGS: CT HEAD FINDINGS Brain: The brainstem, cerebellum, cerebral peduncles, thalami, basal ganglia, basilar cisterns, and ventricular system appear within normal limits. No intracranial hemorrhage, mass lesion, or acute CVA. Vascular: Unremarkable Skull: Unremarkable Sinuses/Orbits: Mild chronic bilateral maxillary, ethmoid, and sphenoid sinusitis. Other: No supplemental non-categorized findings. CT CERVICAL SPINE FINDINGS Alignment: Mild  straightening of the normal cervical lordosis. 1.5 mm degenerative anterolisthesis at C7-T1. Skull base and vertebrae: Spurring and loss of articular space at the anterior C1-2 articulation with superior spurring from the anterior arch of C1 abutting the basion. No fracture or acute bony findings identified. Soft tissues and spinal canal: Unremarkable Disc levels: Uncinate and facet spurring causes osseous foraminal stenosis bilaterally at C4-5, C5-6, and C6-7; and on the left at C3-4. Upper chest: Unremarkable Other: No supplemental non-categorized findings. IMPRESSION: 1. No acute intracranial findings or acute cervical spine findings. 2. Mild chronic paranasal sinusitis. 3. Multilevel cervical impingement due to spurring. Electronically Signed   By: Van Clines M.D.   On: 08/16/2021 15:34   DG Pelvis Portable  Result Date: 07/29/2021 CLINICAL DATA:  [place], found down EXAM: PORTABLE PELVIS 1-2 VIEWS COMPARISON:  04/03/2011 FINDINGS: Single frontal view of the pelvis includes both hips. There are no acute displaced fractures. Mild symmetrical bilateral hip osteoarthritis. Sacroiliac joints are unremarkable. Soft tissues are normal. IMPRESSION: 1. Mild bilateral hip osteoarthritis.  No acute bony abnormality. Electronically Signed   By: Randa Ngo M.D.   On: 08/16/2021 15:19   DG CHEST PORT 1 VIEW  Result Date: 08/23/2021 CLINICAL DATA:  Hypoxia. EXAM: PORTABLE CHEST 1 VIEW COMPARISON:  08/23/2021 FINDINGS: Right IJ catheter tip projects over the distal SVC. ETT tip is stable above the carina. Enteric tube tip is well below the level of the hemidiaphragms. Unchanged cardiac enlargement. Persistent right pleural effusion with thickening of the minor fissure and veil like opacification over the right midlung and right lower lung. IMPRESSION: 1. Stable posterior layering right pleural effusion. 2. Unchanged position of support apparatus. Electronically Signed   By: Kerby Moors M.D.   On: 08/23/2021  09:51   DG CHEST PORT 1 VIEW  Result Date: 08/23/2021 CLINICAL DATA:  Respiratory dependent. Evaluate for atelectasis or infiltrate EXAM: PORTABLE CHEST 1 VIEW COMPARISON:  08/20/2021 FINDINGS: Right central line tip in the SVC.  Endotracheal tube tip is 5.6 cm above the carina. Small right pleural effusion. Right mid and lower lung airspace disease. Left base atelectasis. No effusion on the left. No acute bony abnormality. IMPRESSION: Small right pleural effusion with right mid and lower lung atelectasis or infiltrate. Left base atelectasis. Electronically Signed   By: Rolm Baptise M.D.   On: 08/23/2021 00:58   DG CHEST PORT 1 VIEW  Result Date: 08/26/2021 CLINICAL DATA:  Respiratory failure. EXAM: PORTABLE CHEST 1 VIEW COMPARISON:  08/11/2021. FINDINGS: Heart is enlarged the mediastinal contour is stable. There is hazy opacification of the right lung, possible layering pleural effusion. Mild atelectasis or infiltrate is present at the lung bases bilaterally. A stable right internal jugular central venous catheter is noted. An endotracheal tube terminates 8 cm above the carina. The bony structures are stable. IMPRESSION: 1. Hazy opacification of the right lung, possible layering effusion. 2. Mild atelectasis or infiltrate at the lung bases. 3. Cardiomegaly. Electronically Signed   By: Brett Fairy M.D.   On: 08/14/2021 23:28   Portable Chest x-ray  Result Date: 08/08/2021 CLINICAL DATA:  Endotracheal tube. EXAM: PORTABLE CHEST 1 VIEW COMPARISON:  08/11/2021. FINDINGS: The heart size and mediastinal contours are stable. Atherosclerotic calcification of the aorta is noted. Mild airspace disease is present at the left lung base. No effusion or pneumothorax. Right shoulder arthroplasty changes are noted. A right internal jugular central venous catheter terminates over the superior vena cava. An endotracheal tube terminates 6.9 cm above the carina. No acute osseous abnormality. IMPRESSION: 1. Mild  atelectasis or infiltrate at the left lung base. 2. Cardiomegaly. 3. Medical devices as described above. Electronically Signed   By: Brett Fairy M.D.   On: 08/09/2021 21:42   DG Chest Port 1 View  Result Date: 08/07/2021 CLINICAL DATA:  [place], found down EXAM: PORTABLE CHEST 1 VIEW COMPARISON:  05/03/2008 FINDINGS: Single frontal view of the chest demonstrates an enlarged cardiac silhouette. There is increased central vascular congestion with bibasilar ground-glass opacities and small effusions compatible with congestive heart failure. No pneumothorax. Unremarkable right shoulder arthroplasty. No acute displaced fractures. IMPRESSION: 1. Findings consistent with mild congestive heart failure. Electronically Signed   By: Randa Ngo M.D.   On: 08/03/2021 15:19   ECHOCARDIOGRAM COMPLETE  Result Date: 08/23/2021    ECHOCARDIOGRAM REPORT   Patient Name:   Dany Walther. Date of Exam: 08/23/2021 Medical Rec #:  [id number]            Height:       70.0 in Accession #:    [id number]           Weight:       174.6 lb Date of Birth:  1940-05-21            BSA:          1.970 m Patient Age:    47 years             BP:           79/66 mmHg Patient Gender: M                    HR:           100 bpm. Exam Location:  Inpatient Procedure: 2D Echo, Cardiac Doppler and Color Doppler Indications:    Atrial fibrillation  History:        Patient has no prior history of Echocardiogram examinations.  Risk Factors:Hypertension.  Sonographer:    Luisa Hart RDCS Referring Phys: [id number] [place] Comments: No parasternal window, no apical window, suboptimal subcostal window and Technically difficult study due to poor echo windows. IMPRESSIONS  1. Very limited echo with few images - difficult windows  2. Left ventricular ejection fraction, by estimation, is 20 to 25%. The left ventricle has severely decreased function. The left ventricle demonstrates global hypokinesis. The left  ventricular internal cavity size was mildly dilated. Left ventricular diastolic function could not be evaluated.  3. Right ventricular systolic function is moderately reduced. The right ventricular size is mildly enlarged.  4. Left atrial size was severely dilated.  5. The pericardial effusion is circumferential.  6. The mitral valve is abnormal. Trivial mitral valve regurgitation.  7. The aortic valve was not assessed. Aortic valve regurgitation is not visualized.  8. Rhythm strip during this exam demostrated afib with RVR. Comparison(s): No prior Echocardiogram. Conclusion(s)/Recommendation(s): Critical findings reported to Dr. Erskine Emery and acknowledged at 3:05 pm. FINDINGS  Left Ventricle: Left ventricular ejection fraction, by estimation, is 20 to 25%. The left ventricle has severely decreased function. The left ventricle demonstrates global hypokinesis. The left ventricular internal cavity size was mildly dilated. There is no left ventricular hypertrophy. Left ventricular diastolic function could not be evaluated due to indeterminate diastolic function. Left ventricular diastolic function could not be evaluated. Right Ventricle: The right ventricular size is mildly enlarged. No increase in right ventricular wall thickness. Right ventricular systolic function is moderately reduced. Left Atrium: Left atrial size was severely dilated. Right Atrium: Right atrial size was not assessed. Pericardium: Trivial pericardial effusion is present. The pericardial effusion is circumferential. Mitral Valve: The mitral valve is abnormal. Mild to moderate mitral annular calcification. Trivial mitral valve regurgitation. Tricuspid Valve: The tricuspid valve is grossly normal. Tricuspid valve regurgitation is mild. Aortic Valve: The aortic valve was not assessed. Aortic valve regurgitation is not visualized. Pulmonic Valve: The pulmonic valve was not assessed. Pulmonic valve regurgitation is not visualized. Aorta: Aortic root  could not be assessed. Venous: The inferior vena cava was not well visualized. IAS/Shunts: The interatrial septum was not assessed. EKG: Rhythm strip during this exam demostrated afib with RVR.  RIGHT VENTRICLE RV Basal diam:  4.50 cm RV Mid diam:    3.30 cm LEFT ATRIUM            Index LA Vol (A4C): 105.0 ml 53.29 ml/m  TRICUSPID VALVE TR Peak grad:   28.1 mmHg TR Vmax:        265.00 cm/s Lyman Bishop MD Electronically signed by Lyman Bishop MD Signature Date/Time: 08/23/2021/3:06:01 PM    Final     Microbiology Recent Results (from the past 240 hour(s))  Culture, blood (Routine X 2) w Reflex to ID Panel     Status: None   Collection Time: 08/23/21  6:29 AM   Specimen: BLOOD RIGHT HAND  Result Value Ref Range Status   Specimen Description BLOOD RIGHT HAND  Final   Special Requests IN PEDIATRIC BOTTLE Blood Culture adequate volume  Final   Culture   Final    NO GROWTH 5 DAYS Performed at [facility], [place] [address], [place], [place] [address]    Report Status 08/28/2021 FINAL  Final  Culture, blood (Routine X 2) w Reflex to ID Panel     Status: None   Collection Time: 08/23/21  6:39 AM   Specimen: BLOOD  Result Value Ref Range Status  Specimen Description BLOOD RIGHT THUMB  Final   Special Requests   Final    BOTTLES DRAWN AEROBIC AND ANAEROBIC Blood Culture adequate volume   Culture   Final    NO GROWTH 5 DAYS Performed at [facility], [address], [place], [place] [address]    Report Status 08/28/2021 FINAL  Final    Lab Basic Metabolic Panel: No results for input(s): NA, K, CL, CO2, GLUCOSE, BUN, CREATININE, CALCIUM, MG, PHOS in the last 168 hours. Liver Function Tests: No results for input(s): AST, ALT, ALKPHOS, BILITOT, PROT, ALBUMIN in the last 168 hours. No results for input(s): LIPASE, AMYLASE in the last 168 hours. No results for input(s): AMMONIA in the last 168 hours. CBC: No results for input(s): WBC, NEUTROABS, HGB, HCT, MCV, PLT in the last 168  hours. Cardiac Enzymes: No results for input(s): CKTOTAL, CKMB, CKMBINDEX, TROPONINI in the last 168 hours. Sepsis Labs: No results for input(s): PROCALCITON, WBC, LATICACIDVEN in the last 168 hours.    Candee Furbish 09/01/2021, 7:37 PM

## 2022-06-06 IMAGING — CT CT HEAD W/O CM
4 series · 15 of 47 positions shown, 17 images · non-contrast
Comparison: MRI brain 06/03/2017

CLINICAL DATA: Head and neck trauma. Patient found down on floor,
estimated 12-24 hours before being found.



[Series 3: head without · axial · non-contrast · 0.43mm/px · z∈[-81,+39]mm · 7 of 34 slices shown, 9 images]
[im 5/34  brain]
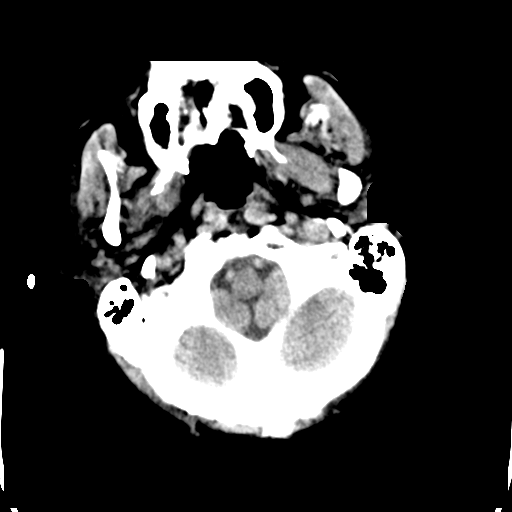
[im 5/34  bone]
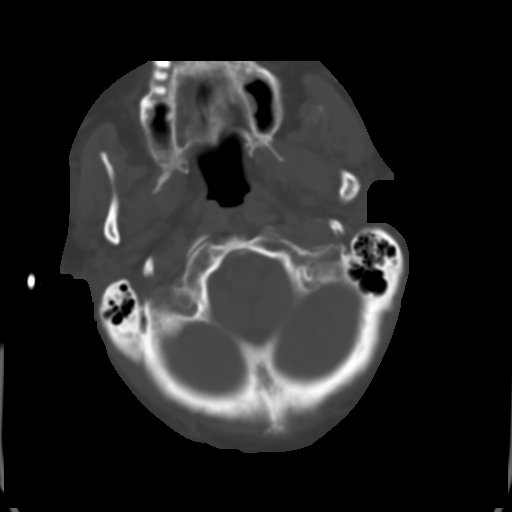
[im 9/34  brain]
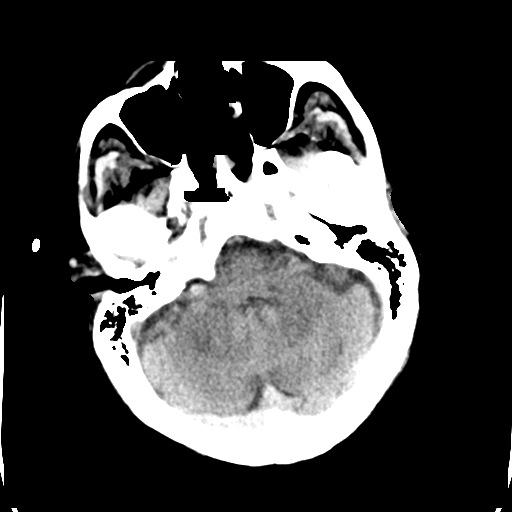
[im 13/34  brain]
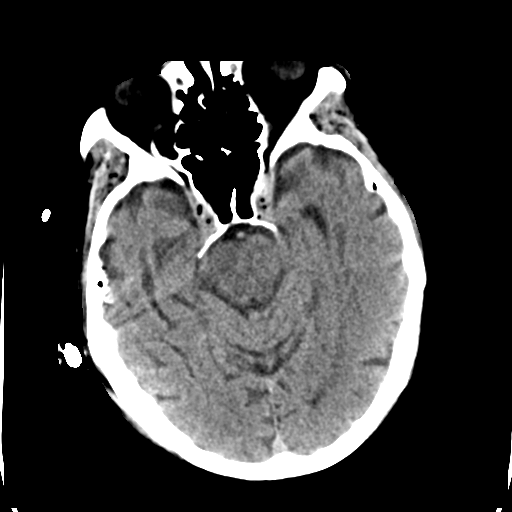
[im 17/34  brain]
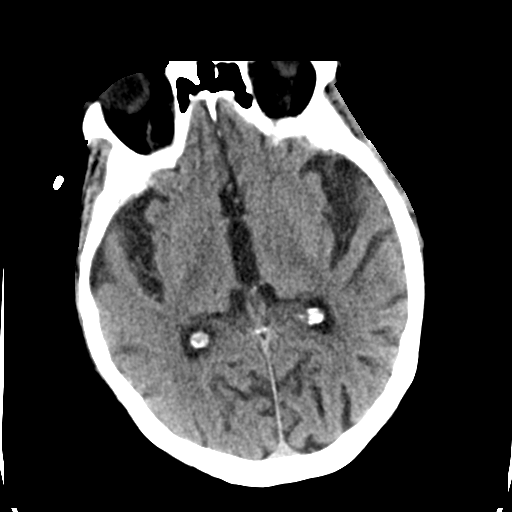
[im 21/34  brain]
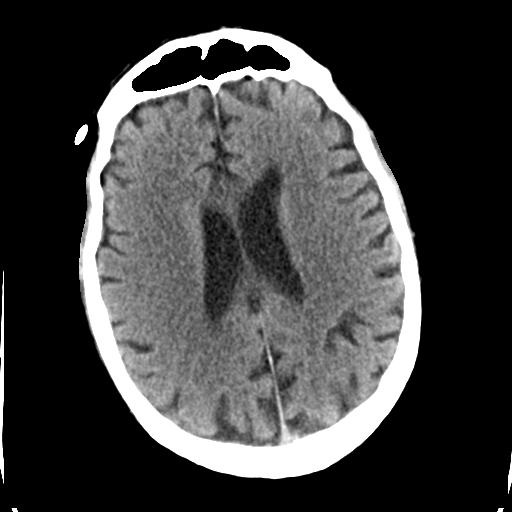
[im 21/34  bone]
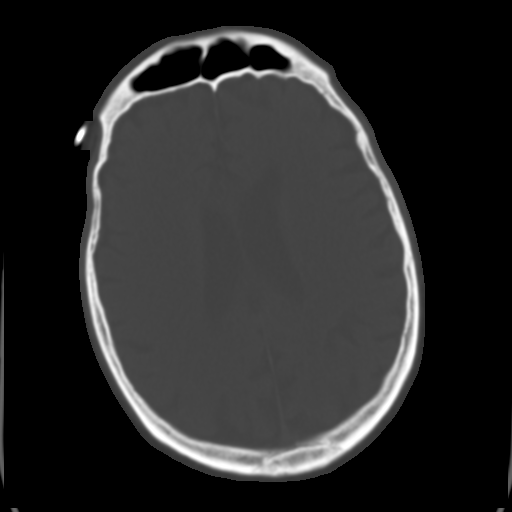
[im 25/34  brain]
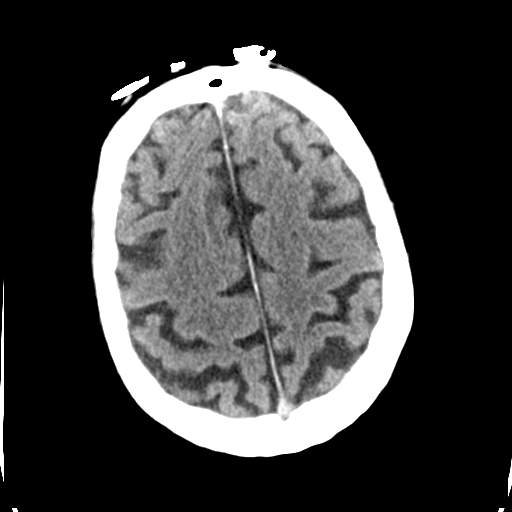
[im 29/34  brain]
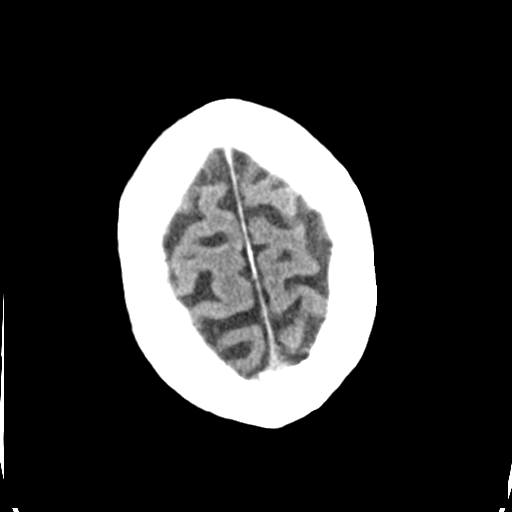

[Series 4: head bone · axial · 0.43mm/px · z∈[-85,-69]mm · 2 of 85 slices shown]
[im 9/85  bone]
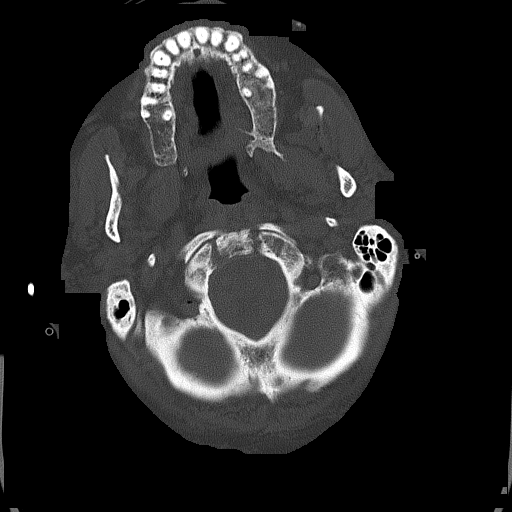
[im 17/85  bone]
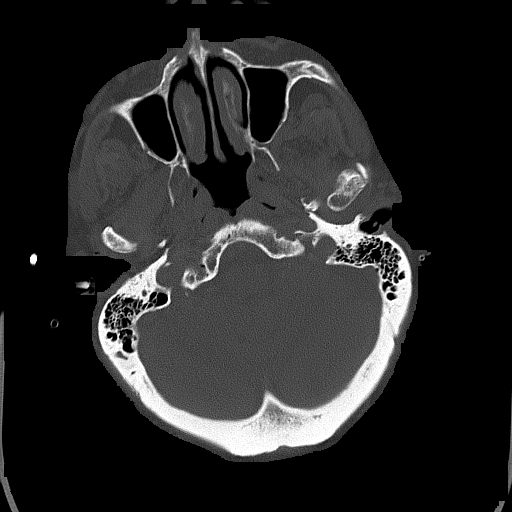

[Series 5: head without cor · coronal · non-contrast · 0.33mm/px · 3 of 75 slices shown]
[im 25/75  brain]
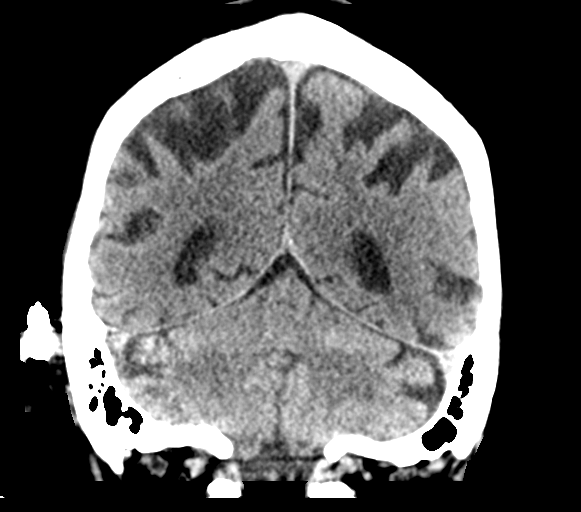
[im 33/75  brain]
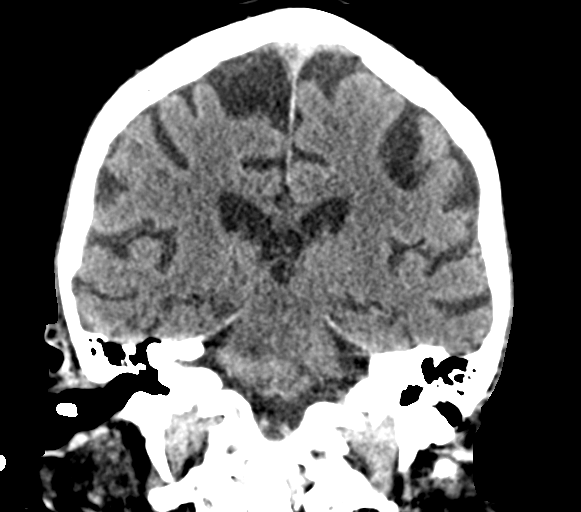
[im 42/75  brain]
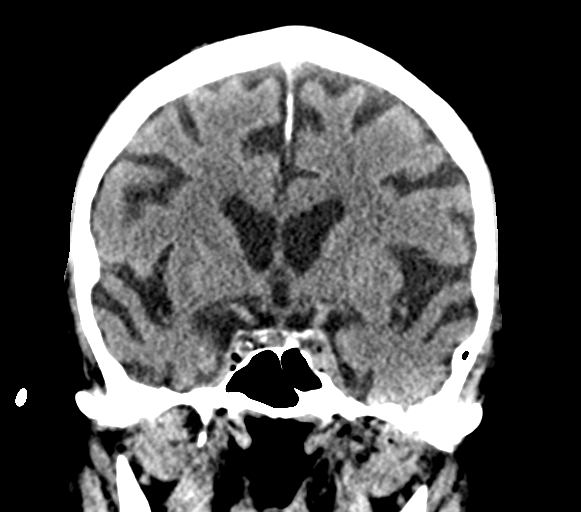

[Series 6: head without sag · sagittal · non-contrast · 0.33mm/px · 3 of 65 slices shown]
[im 22/65  brain]
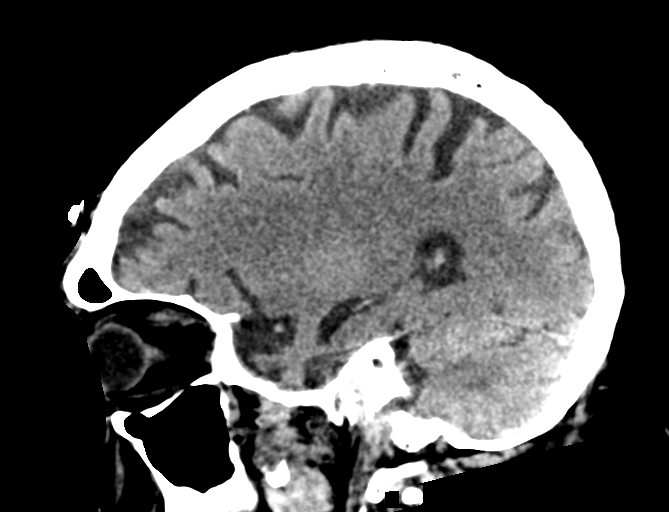
[im 33/65  brain]
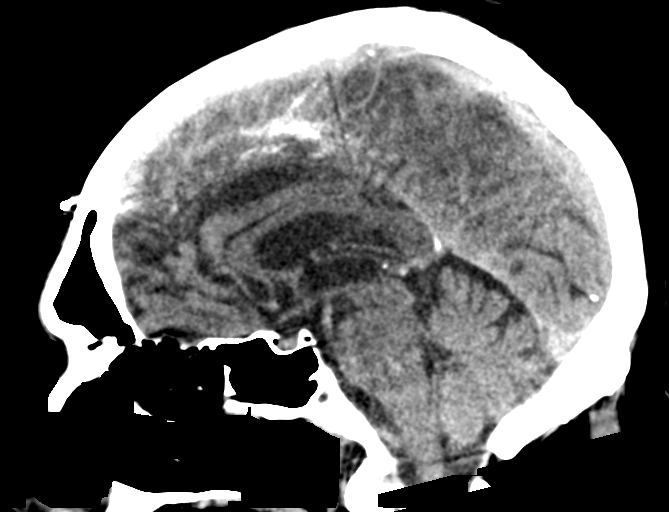
[im 43/65  brain]
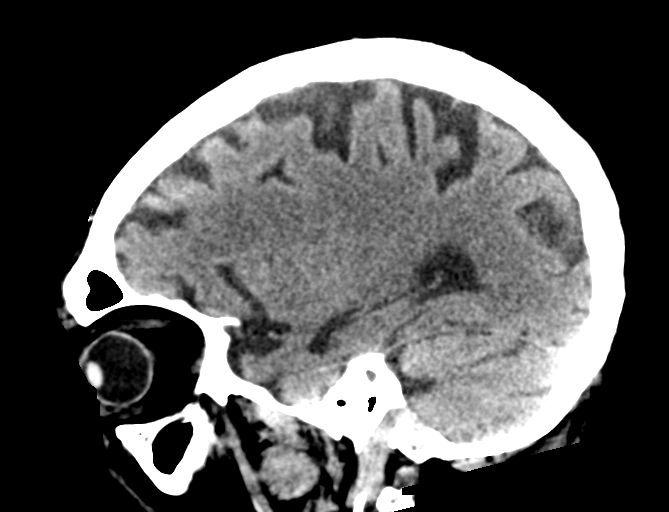

[15 of 47 positions shown; findings below may reference images not displayed]

FINDINGS: CT HEAD FINDINGS

Brain: The brainstem, cerebellum, cerebral peduncles, thalami, basal
ganglia, basilar cisterns, and ventricular system appear within
normal limits. No intracranial hemorrhage, mass lesion, or acute
CVA.

Vascular: Unremarkable

Skull: Unremarkable

Sinuses/Orbits: Mild chronic bilateral maxillary, ethmoid, and
sphenoid sinusitis.

Other: No supplemental non-categorized findings.

CT CERVICAL SPINE FINDINGS

Alignment: Mild straightening of the normal cervical lordosis.
mm degenerative anterolisthesis at C7-T1.

Skull base and vertebrae: Spurring and loss of articular space at
the anterior C1-2 articulation with superior spurring from the
anterior arch of C1 abutting the basion. No fracture or acute bony
findings identified.

Soft tissues and spinal canal: Unremarkable

Disc levels: Uncinate and facet spurring causes osseous foraminal
stenosis bilaterally at C4-5, C5-6, and C6-7; and on the left at
C3-4.

Upper chest: Unremarkable

Other: No supplemental non-categorized findings.
IMPRESSION: 1. No acute intracranial findings or acute cervical spine findings.
2. Mild chronic paranasal sinusitis.
3. Multilevel cervical impingement due to spurring.
